# Patient Record
Sex: Female | Born: 1960 | Race: White | Hispanic: No | Marital: Married | Smoking: Never smoker
Health system: Southern US, Community
[De-identification: ages and names within clinical notes are randomized; demographics above are authoritative.]

## PROBLEM LIST (undated history)

## (undated) DIAGNOSIS — I341 Nonrheumatic mitral (valve) prolapse: Secondary | ICD-10-CM

## (undated) DIAGNOSIS — I7300 Raynaud's syndrome without gangrene: Secondary | ICD-10-CM

## (undated) DIAGNOSIS — D649 Anemia, unspecified: Secondary | ICD-10-CM

## (undated) DIAGNOSIS — K589 Irritable bowel syndrome without diarrhea: Secondary | ICD-10-CM

## (undated) DIAGNOSIS — B019 Varicella without complication: Secondary | ICD-10-CM

## (undated) DIAGNOSIS — N390 Urinary tract infection, site not specified: Secondary | ICD-10-CM

## (undated) DIAGNOSIS — Z808 Family history of malignant neoplasm of other organs or systems: Secondary | ICD-10-CM

## (undated) DIAGNOSIS — N83209 Unspecified ovarian cyst, unspecified side: Secondary | ICD-10-CM

## (undated) HISTORY — DX: Irritable bowel syndrome, unspecified: K58.9

## (undated) HISTORY — DX: Urinary tract infection, site not specified: N39.0

## (undated) HISTORY — DX: Unspecified ovarian cyst, unspecified side: N83.209

## (undated) HISTORY — DX: Varicella without complication: B01.9

## (undated) HISTORY — DX: Family history of malignant neoplasm of other organs or systems: Z80.8

## (undated) HISTORY — PX: TONSILLECTOMY: SUR1361

## (undated) HISTORY — DX: Raynaud's syndrome without gangrene: I73.00

## (undated) HISTORY — DX: Anemia, unspecified: D64.9

## (undated) HISTORY — DX: Nonrheumatic mitral (valve) prolapse: I34.1

---

## 1999-09-30 ENCOUNTER — Ambulatory Visit (HOSPITAL_COMMUNITY): Admission: RE | Admit: 1999-09-30 | Discharge: 1999-09-30 | Payer: Self-pay | Admitting: Obstetrics and Gynecology

## 1999-09-30 ENCOUNTER — Encounter: Payer: Self-pay | Admitting: *Deleted

## 1999-10-03 ENCOUNTER — Other Ambulatory Visit: Admission: RE | Admit: 1999-10-03 | Discharge: 1999-10-03 | Payer: Self-pay | Admitting: Obstetrics and Gynecology

## 2000-02-25 ENCOUNTER — Inpatient Hospital Stay (HOSPITAL_COMMUNITY): Admission: AD | Admit: 2000-02-25 | Discharge: 2000-02-27 | Payer: Self-pay | Admitting: Obstetrics and Gynecology

## 2001-01-26 ENCOUNTER — Other Ambulatory Visit: Admission: RE | Admit: 2001-01-26 | Discharge: 2001-01-26 | Payer: Self-pay | Admitting: Obstetrics and Gynecology

## 2002-01-04 ENCOUNTER — Ambulatory Visit (HOSPITAL_COMMUNITY): Admission: RE | Admit: 2002-01-04 | Discharge: 2002-01-04 | Payer: Self-pay | Admitting: Obstetrics and Gynecology

## 2002-01-04 ENCOUNTER — Encounter: Payer: Self-pay | Admitting: Obstetrics and Gynecology

## 2002-01-18 ENCOUNTER — Encounter: Payer: Self-pay | Admitting: Obstetrics and Gynecology

## 2002-01-18 ENCOUNTER — Ambulatory Visit (HOSPITAL_COMMUNITY): Admission: RE | Admit: 2002-01-18 | Discharge: 2002-01-18 | Payer: Self-pay | Admitting: Obstetrics and Gynecology

## 2002-02-01 ENCOUNTER — Encounter: Payer: Self-pay | Admitting: Obstetrics and Gynecology

## 2002-02-01 ENCOUNTER — Ambulatory Visit (HOSPITAL_COMMUNITY): Admission: RE | Admit: 2002-02-01 | Discharge: 2002-02-01 | Payer: Self-pay | Admitting: Obstetrics and Gynecology

## 2002-02-13 ENCOUNTER — Other Ambulatory Visit: Admission: RE | Admit: 2002-02-13 | Discharge: 2002-02-13 | Payer: Self-pay | Admitting: Obstetrics and Gynecology

## 2002-05-02 ENCOUNTER — Ambulatory Visit (HOSPITAL_COMMUNITY): Admission: RE | Admit: 2002-05-02 | Discharge: 2002-05-02 | Payer: Self-pay | Admitting: Obstetrics and Gynecology

## 2002-05-02 ENCOUNTER — Encounter: Payer: Self-pay | Admitting: Obstetrics and Gynecology

## 2002-07-02 ENCOUNTER — Inpatient Hospital Stay (HOSPITAL_COMMUNITY): Admission: AD | Admit: 2002-07-02 | Discharge: 2002-07-03 | Payer: Self-pay | Admitting: Obstetrics and Gynecology

## 2004-04-15 ENCOUNTER — Other Ambulatory Visit: Admission: RE | Admit: 2004-04-15 | Discharge: 2004-04-15 | Payer: Self-pay | Admitting: Obstetrics and Gynecology

## 2004-05-28 ENCOUNTER — Encounter: Payer: Self-pay | Admitting: Internal Medicine

## 2004-06-18 ENCOUNTER — Ambulatory Visit (HOSPITAL_COMMUNITY): Admission: RE | Admit: 2004-06-18 | Discharge: 2004-06-18 | Payer: Self-pay | Admitting: Cardiology

## 2004-06-18 ENCOUNTER — Encounter: Payer: Self-pay | Admitting: Cardiology

## 2004-07-08 ENCOUNTER — Encounter: Payer: Self-pay | Admitting: Internal Medicine

## 2004-07-08 ENCOUNTER — Ambulatory Visit (HOSPITAL_COMMUNITY): Admission: RE | Admit: 2004-07-08 | Discharge: 2004-07-08 | Payer: Self-pay | Admitting: Obstetrics and Gynecology

## 2004-07-08 ENCOUNTER — Ambulatory Visit: Payer: Self-pay | Admitting: Cardiology

## 2004-07-20 ENCOUNTER — Ambulatory Visit: Payer: Self-pay | Admitting: Internal Medicine

## 2004-07-27 ENCOUNTER — Encounter: Admission: RE | Admit: 2004-07-27 | Discharge: 2004-07-27 | Payer: Self-pay | Admitting: Internal Medicine

## 2004-08-06 ENCOUNTER — Ambulatory Visit: Payer: Self-pay | Admitting: Internal Medicine

## 2004-08-09 ENCOUNTER — Encounter: Payer: Self-pay | Admitting: Internal Medicine

## 2007-04-13 DIAGNOSIS — D649 Anemia, unspecified: Secondary | ICD-10-CM | POA: Insufficient documentation

## 2007-04-13 DIAGNOSIS — Z85828 Personal history of other malignant neoplasm of skin: Secondary | ICD-10-CM | POA: Insufficient documentation

## 2007-11-20 ENCOUNTER — Ambulatory Visit (HOSPITAL_COMMUNITY): Admission: RE | Admit: 2007-11-20 | Discharge: 2007-11-20 | Payer: Self-pay | Admitting: Obstetrics and Gynecology

## 2008-01-02 ENCOUNTER — Encounter: Admission: RE | Admit: 2008-01-02 | Discharge: 2008-01-02 | Payer: Self-pay | Admitting: Obstetrics and Gynecology

## 2009-03-19 ENCOUNTER — Ambulatory Visit: Payer: Self-pay | Admitting: Internal Medicine

## 2009-03-19 DIAGNOSIS — R519 Headache, unspecified: Secondary | ICD-10-CM | POA: Insufficient documentation

## 2009-03-19 DIAGNOSIS — N83209 Unspecified ovarian cyst, unspecified side: Secondary | ICD-10-CM | POA: Insufficient documentation

## 2009-03-19 DIAGNOSIS — R209 Unspecified disturbances of skin sensation: Secondary | ICD-10-CM | POA: Insufficient documentation

## 2009-03-19 DIAGNOSIS — Z8679 Personal history of other diseases of the circulatory system: Secondary | ICD-10-CM | POA: Insufficient documentation

## 2009-03-19 DIAGNOSIS — R51 Headache: Secondary | ICD-10-CM | POA: Insufficient documentation

## 2009-03-23 LAB — CONVERTED CEMR LAB
ALT: 21 units/L (ref 0–35)
AST: 24 units/L (ref 0–37)
Albumin: 4.4 g/dL (ref 3.5–5.2)
Alkaline Phosphatase: 39 units/L (ref 39–117)
Basophils Relative: 0.1 % (ref 0.0–3.0)
Bilirubin, Direct: 0.2 mg/dL (ref 0.0–0.3)
CO2: 28 meq/L (ref 19–32)
Chloride: 105 meq/L (ref 96–112)
Eosinophils Relative: 1.2 % (ref 0.0–5.0)
Glucose, Bld: 89 mg/dL (ref 70–99)
HCT: 38.2 % (ref 36.0–46.0)
Lymphs Abs: 1.3 10*3/uL (ref 0.7–4.0)
MCV: 91.7 fL (ref 78.0–100.0)
Monocytes Absolute: 0.6 10*3/uL (ref 0.1–1.0)
Monocytes Relative: 7.7 % (ref 3.0–12.0)
Potassium: 4 meq/L (ref 3.5–5.1)
RBC: 4.16 M/uL (ref 3.87–5.11)
Sodium: 139 meq/L (ref 135–145)
TSH: 1.31 microintl units/mL (ref 0.35–5.50)
Total CHOL/HDL Ratio: 2
Total Protein: 7.4 g/dL (ref 6.0–8.3)
WBC: 8.2 10*3/uL (ref 4.5–10.5)

## 2009-05-21 ENCOUNTER — Ambulatory Visit: Payer: Self-pay | Admitting: Cardiology

## 2010-09-18 ENCOUNTER — Encounter: Payer: Self-pay | Admitting: Obstetrics and Gynecology

## 2011-01-14 NOTE — Op Note (Signed)
[facility]  Patient:    Destiny Carpenter, Destiny Carpenter Visit Number: [id number] MRN: [id number]          Service Type: Attending:  Silverio Lay, M.D. Dictated by:   Silverio Lay, M.D.                             Operative Report  DATE OF BIRTH:                1960-12-13  PREOPERATIVE DIAGNOSES:       1. Advanced maternal age.                               2. Intrauterine pregnancy at 15 weeks and                                  5 days.  POSTOPERATIVE DIAGNOSES:      1. Advanced maternal age.                               2. Intrauterine pregnancy at 15 weeks and                                  5 days.  OPERATION:                    Amniocentesis.  SURGEON:                      Silverio Lay, M.D.  ANESTHESIA:                   None.  DESCRIPTION OF PROCEDURE:     After being informed of the planned procedure, namely diagnostic genetic amniocentesis, as well as the possible complications including premature rupture of membranes, infection, bleeding, and miscarriage, rate of 1 in 200.  Informed consent was obtained and the patient was placed in the supine position.  With ultrasound, we confirmed placental location which is fundal and anterior as well as a live single intrauterine pregnancy with the heart rate in the 140s to 150s.  The patient declined local anesthesia.  She was then prepped with Betadine and draped, and under ultrasound guidance, using a 22-gauge needle, a single puncture was made to reach a large pocket of amniotic fluid and to avoid transplacental passage. Twenty-one cubic centimeters of clear fluid was removed.  The needle was promptly removed and post amniocentesis ultrasound revealed a live viable female infant with a heart rate of 147.  FINAL DIAGNOSIS:              Advanced maternal age, status post                               amniocentesis.  CONDITION AT DISCHARGE:       Well and stable. Dictated by:   Silverio Lay,  M.D. Attending:  Silverio Lay, M.D. DD:  01/04/02 TD:  01/07/02 Job: [id number]

## 2011-01-14 NOTE — H&P (Signed)
   NAME:  Destiny Carpenter, Destiny Carpenter NO.:  [id number]   MEDICAL RECORD NO.:  [id number]                   PATIENT TYPE:  MAT   LOCATION:  [facility]                                 FACILITY:  [facility]   PHYSICIAN:  Osborn Coho, M.D.                DATE OF BIRTH:  [birth year]   DATE OF ADMISSION:  07/02/2002  DATE OF DISCHARGE:                                HISTORY & PHYSICAL   HISTORY OF PRESENT ILLNESS:  This is a 50 year old gravida 3, para 1-0-1-1  at 48 and 2/7 weeks who presents with active labor.  She denies any leaking  or bleeding.  Reports positive fetal movement.  Contractions began last  night and increased in intensity this morning.  Pregnancy has been followed  by the nurse midwife service and remarkable for AMA, abnormal LMP, group B  Strep negative, fetal renal pyelectasis.   OB HISTORY:  Remarkable for vaginal delivery in 2001 of a female infant at  [redacted] weeks gestation weighing 5 pounds 10 ounces complicated by IUGR and  oligohydramnios.  She had a spontaneous abortion in 2002 in the first  trimester with no complications.   PAST MEDICAL HISTORY:  Remarkable for varicella immunization, history of low  grade skin cancer, history of a concussion at age 48, IBS, and mitral valve  prolapse.   PAST SURGICAL HISTORY:  Remarkable for tonsillectomy.   FAMILY HISTORY:  Remarkable for a grandmother with MI and hypertension.  Mother with varicosities.   GENETIC HISTORY:  Unremarkable.   SOCIAL HISTORY:  The patient is married to [employer] who is involved  and supportive.  She is of the Protestant faith.  She denies any alcohol,  tobacco, or drug use.   PHYSICAL EXAMINATION:  VITAL SIGNS:  Stable.  Afebrile.  HEENT:  Within normal limits.  NECK:  Thyroid normal, not enlarged.  CHEST:  Clear to auscultation.  BREASTS:  Soft, nontender.  ABDOMEN:  Gravid at 35 cm.  Vertex to Leopold's.  EFM shows reassuring fetal  heart rate with uterine contractions  every four to five minutes.  PELVIC:  Cervical examination is 8 cm, 80% effaced, -1/0 station, vertex  presentation.  EXTREMITIES:  Within normal limits.   ASSESSMENT:  1. Intrauterine pregnancy at 41 and 2/7 weeks.  2. Active labor, transition.  3. Group B Strep negative.    PLAN:  1. Admit to birthing suite.  Dr. Su Hilt notified.  2. Routine C.N.M. orders.  3. Anticipate SVD.     Marie L. Williams, C.N.M.                 Osborn Coho, M.D.    MLW/MEDQ  D:  07/02/2002  T:  07/02/2002  Job:  [id number]

## 2011-03-09 ENCOUNTER — Other Ambulatory Visit: Payer: Self-pay | Admitting: Obstetrics and Gynecology

## 2011-03-09 DIAGNOSIS — Z1231 Encounter for screening mammogram for malignant neoplasm of breast: Secondary | ICD-10-CM

## 2011-03-10 ENCOUNTER — Ambulatory Visit
Admission: RE | Admit: 2011-03-10 | Discharge: 2011-03-10 | Disposition: A | Discharge status: Home / Self Care | Payer: BC Managed Care – PPO | Source: Ambulatory Visit | Attending: Obstetrics and Gynecology | Admitting: Obstetrics and Gynecology

## 2011-03-10 DIAGNOSIS — Z1231 Encounter for screening mammogram for malignant neoplasm of breast: Secondary | ICD-10-CM

## 2011-06-10 ENCOUNTER — Ambulatory Visit (INDEPENDENT_AMBULATORY_CARE_PROVIDER_SITE_OTHER): Payer: BC Managed Care – PPO

## 2011-06-10 DIAGNOSIS — Z23 Encounter for immunization: Secondary | ICD-10-CM

## 2011-08-26 ENCOUNTER — Encounter: Payer: Self-pay | Admitting: Internal Medicine

## 2011-09-13 ENCOUNTER — Other Ambulatory Visit (INDEPENDENT_AMBULATORY_CARE_PROVIDER_SITE_OTHER): Payer: BC Managed Care – PPO

## 2011-09-13 DIAGNOSIS — Z0000 Encounter for general adult medical examination without abnormal findings: Secondary | ICD-10-CM

## 2011-09-13 LAB — POCT URINALYSIS DIPSTICK
Bilirubin, UA: NEGATIVE
Glucose, UA: NEGATIVE
Ketones, UA: NEGATIVE
Leukocytes, UA: NEGATIVE
Protein, UA: NEGATIVE
Spec Grav, UA: 1.02

## 2011-09-13 LAB — HEPATIC FUNCTION PANEL
ALT: 25 U/L (ref 0–35)
AST: 27 U/L (ref 0–37)
Albumin: 4.4 g/dL (ref 3.5–5.2)
Total Bilirubin: 0.9 mg/dL (ref 0.3–1.2)
Total Protein: 6.7 g/dL (ref 6.0–8.3)

## 2011-09-13 LAB — BASIC METABOLIC PANEL
BUN: 15 mg/dL (ref 6–23)
Chloride: 102 mEq/L (ref 96–112)
GFR: 77.75 mL/min (ref >60.00)
Glucose, Bld: 84 mg/dL (ref 70–99)
Potassium: 3.7 mEq/L (ref 3.5–5.1)
Sodium: 138 mEq/L (ref 135–145)

## 2011-09-13 LAB — TSH: TSH: 0.93 u[IU]/mL (ref 0.35–5.50)

## 2011-09-13 LAB — CBC WITH DIFFERENTIAL/PLATELET
Eosinophils Relative: 2.1 % (ref 0.0–5.0)
HCT: 37.2 % (ref 36.0–46.0)
Hemoglobin: 12.7 g/dL (ref 12.0–15.0)
Lymphs Abs: 0.9 10*3/uL (ref 0.7–4.0)
MCV: 94.6 fl (ref 78.0–100.0)
Monocytes Absolute: 0.5 10*3/uL (ref 0.1–1.0)
Monocytes Relative: 9 % (ref 3.0–12.0)
Neutro Abs: 3.8 10*3/uL (ref 1.4–7.7)
Platelets: 216 10*3/uL (ref 150.0–400.0)
WBC: 5.4 10*3/uL (ref 4.5–10.5)

## 2011-09-13 LAB — LIPID PANEL: Cholesterol: 160 mg/dL (ref 0–200)

## 2011-09-14 LAB — VITAMIN D 25 HYDROXY (VIT D DEFICIENCY, FRACTURES): Vit D, 25-Hydroxy: 42 ng/mL (ref 30–89)

## 2011-09-20 ENCOUNTER — Ambulatory Visit (INDEPENDENT_AMBULATORY_CARE_PROVIDER_SITE_OTHER): Payer: BC Managed Care – PPO | Admitting: Internal Medicine

## 2011-09-20 ENCOUNTER — Other Ambulatory Visit (HOSPITAL_COMMUNITY)
Admission: RE | Admit: 2011-09-20 | Discharge: 2011-09-20 | Disposition: A | Discharge status: Home / Self Care | Payer: BC Managed Care – PPO | Source: Ambulatory Visit | Attending: Internal Medicine | Admitting: Internal Medicine

## 2011-09-20 ENCOUNTER — Encounter: Payer: Self-pay | Admitting: Internal Medicine

## 2011-09-20 VITALS — BP 100/60 | HR 78 | Ht 67.5 in | Wt 127.0 lb

## 2011-09-20 DIAGNOSIS — Z01419 Encounter for gynecological examination (general) (routine) without abnormal findings: Secondary | ICD-10-CM | POA: Insufficient documentation

## 2011-09-20 DIAGNOSIS — H6980 Other specified disorders of Eustachian tube, unspecified ear: Secondary | ICD-10-CM | POA: Insufficient documentation

## 2011-09-20 DIAGNOSIS — Z8679 Personal history of other diseases of the circulatory system: Secondary | ICD-10-CM

## 2011-09-20 DIAGNOSIS — H5789 Other specified disorders of eye and adnexa: Secondary | ICD-10-CM

## 2011-09-20 DIAGNOSIS — Z0000 Encounter for general adult medical examination without abnormal findings: Secondary | ICD-10-CM | POA: Insufficient documentation

## 2011-09-20 NOTE — Progress Notes (Signed)
Subjective:    Patient ID: Destiny Carpenter, female    DOB: December 18, 1960, 51 y.o.   MRN: [id number]  HPI Patient comes in today for Preventive Health Care visit  Has a few concerns. Generally healthy but recently has had a ur sx and eye sx . Left eye red at times. Drops Mild  head ache  For weeks and feels like ears popping.   And hard to clear.  Some congestion  Slight blood recently .   And tried  Guifenesin. For 3 days and sudafed. No change. Left eye more clogged In am.  Extended right elbow   Tennis  And problem  But getting better  Trained for a mini triathalon.   In summer and left knee achesd after running but exercising  45 minutes.  3 x per week.  Periods  Every 4 weeks and on time.    Contraception :  Rhythm .  Plus.   Due for pap.  Has hx of MVP  And supposed to get cards check every other year but thinks it can wait  As has no exercise sx  Cost is an issue with insurance plan.  Review of Systems ROS:  GEN/ HEENTNo fever, significant weight changes sweats headaches vision problems hearing changes, has glasses  See HPI  CV/ PULM; No chest pain shortness of breath cough, syncope,edema  change in exercise tolerance. GI /GU: No adominal pain, vomiting, change in bowel habits. No blood in the stool. No significant GU symptoms. SKIN/HEME: ,no acute skin rashes suspicious lesions or bleeding. No lymphadenopathy, nodules, masses.  NEURO/ PSYCH:  No neurologic signs such as weakness numbness No depression anxiety. IMM/ Allergy: No unusual infections.  Allergy .   REST of 12 system review negative  Past Medical History  Diagnosis Date  . Personal history of unspecified circulatory disease   . Other and unspecified ovarian cyst   . Family history of other specified malignant neoplasm   . Anemia   . UTI (lower urinary tract infection)   . Varicella     History   Social History  . Marital Status: Married    Spouse Name: N/A    Number of Children: N/A  . Years of  Education: N/A   Occupational History  . Not on file.   Social History Main Topics  . Smoking status: Never Smoker   . Smokeless tobacco: Not on file  . Alcohol Use: Yes     5 times a week  . Drug Use: No  . Sexually Active: Not on file   Other Topics Concern  . Not on file   Social History Narrative   MarriedHomemakerHH of 4Pet CarPhD MathPart time    Past Surgical History  Procedure Date  . Tonsillectomy     Family History  Problem Relation Age of Onset  . Hyperlipidemia    . Hypertension    . Melanoma    . Skin cancer      No Known Allergies  Current Outpatient Prescriptions on File Prior to Visit  Medication Sig Dispense Refill  . VITAMIN D, CHOLECALCIFEROL, PO Take by mouth.          BP 100/60  Pulse 78  Ht 5' 7.5" (1.715 m)  Wt 127 lb (57.607 kg)  BMI 19.60 kg/m2  LMP 08/30/2011       Objective:   Physical Exam  Physical Exam: Vital signs reviewed WUJ:WJXB is a well-developed well-nourished alert cooperative  white female who appears her stated age in  no acute distress.  HEENT: normocephalic atraumatic , Eyes: PERRL EOM's full, conjunctiva left lateral area is red but no flush and discharge , Nares: paten,t no deformity discharge or tenderness.,mild congestion Ears: no deformity EAC's clear TMs with normal landmarks. Mouth: clear OP, no lesions, edema.  Moist mucous membranes. Dentition in adequate repair. NECK: supple without masses, thyromegaly or bruits. Breast: normal by inspection . No dimpling, discharge, masses, tenderness or discharge . CHEST/PULM:  Clear to auscultation and percussion breath sounds equal no wheeze , rales or rhonchi. No chest wall deformities or tenderness. CV: PMI is nondisplaced, S1 S2 no gallops, murmurs, rubs ? Intermittent click. Peripheral pulses are full without delay.No JVD .  ABDOMEN: Bowel sounds normal nontender  No guard or rebound, no hepato splenomegal no CVA tenderness.  No hernia. Extremtities:  No clubbing  cyanosis or edema, no acute joint swelling or redness no focal atrophy NEURO:  Oriented x3, cranial nerves 3-12 appear to be intact, no obvious focal weakness,gait within normal limits no abnormal reflexes or asymmetrical SKIN: No acute rashes normal turgor, color, no bruising or petechiae. PSYCH: Oriented, good eye contact, no obvious depression anxiety, cognition and judgment appear normal. LN: no cervical axillary inguinal adenopathy Pelvic: NL ext GU, labia clear without lesions or rash . Vagina no lesions .Cervix: clear  UTERUS: Neg CMT Adnexa:  clear no masses . PAP done rectal neg stool heme  Lab Results  Component Value Date   WBC 5.4 09/13/2011   HGB 12.7 09/13/2011   HCT 37.2 09/13/2011   PLT 216.0 09/13/2011   GLUCOSE 84 09/13/2011   CHOL 160 09/13/2011   TRIG 49.0 09/13/2011   HDL 93.20 09/13/2011   LDLCALC 57 09/13/2011   ALT 25 09/13/2011   AST 27 09/13/2011   NA 138 09/13/2011   K 3.7 09/13/2011   CL 102 09/13/2011   CREATININE 0.8 09/13/2011   BUN 15 09/13/2011   CO2 28 09/13/2011   TSH 0.93 09/13/2011            Assessment & Plan:  Preventive Health Care Counseled regarding healthy nutrition, exercise, sleep, injury prevention, calcium vit d and healthy weight . Cont vit d and exercise for now Nasal congestion  Mostly left eustachian tube dyfsunction.   Can try sample of nasonex   For 1-2 weeks etc  Fu if    persistent or progressive  Left eye  Lateral redness  nodc  Noted   Consider see eye doc if  persistent or progressive   Hx of mVP  No m heard today  Sees cards but prefers  Less often cause of cost   Exam  Ok today as well as exercise tolerance at this time.

## 2011-09-20 NOTE — Patient Instructions (Addendum)
Your sx act like eustachian tube dysfunction. Can try plain claritin or allegra or zyrtec  Saline washes   And  Trial of nasonex 2 sp each nostril each day to see if helps.  Have your eye doctor see your eye redness.   Otherwise Continue lifestyle intervention healthy eating and exercise . Will contact you about pap results when available.  If ok PAP every 3 years   mammo every 2 years  colonoscopy at age 51  Continue vit d for now.

## 2011-09-22 ENCOUNTER — Encounter: Payer: Self-pay | Admitting: Internal Medicine

## 2011-09-22 DIAGNOSIS — H5789 Other specified disorders of eye and adnexa: Secondary | ICD-10-CM | POA: Insufficient documentation

## 2011-09-22 NOTE — Progress Notes (Signed)
Quick Note:  Tell patient PAP is normal. ______ 

## 2011-09-26 ENCOUNTER — Encounter: Payer: Self-pay | Admitting: *Deleted

## 2011-09-26 NOTE — Progress Notes (Signed)
Quick Note:    Letter sent to pt.  ______

## 2012-07-10 ENCOUNTER — Ambulatory Visit (INDEPENDENT_AMBULATORY_CARE_PROVIDER_SITE_OTHER): Payer: BC Managed Care – PPO | Admitting: Family Medicine

## 2012-07-10 DIAGNOSIS — Z23 Encounter for immunization: Secondary | ICD-10-CM

## 2012-09-12 ENCOUNTER — Telehealth: Payer: Self-pay | Admitting: Internal Medicine

## 2012-09-12 NOTE — Telephone Encounter (Signed)
[facility] Triage Call Report Triage Record Num: [id number] Operator: Freddie Breech [name] Call Date & Time: 09/11/2012 6:04:00PM Patient Phone: [phone] PCP: Neta Mends. Panosh Patient Gender: Female PCP Fax : [phone] Patient DOB: 30-Aug-1960 [facility] Name: [facility] Reason for Call: Pt is calling to report urinary frequency, buring and hematuria onset 09/09/12. Advised UC now per [facility]y U[facility] Protocol. Protocol(s) Used: [facility] U[facility] Recommended Outcome per Protocol: See Provider within 4 hours Reason for Outcome: Urinary tract symptoms AND any flank, low back, lower abdominal or genital area (labia, vagina OR testicle/scrotum) pain Care Advice: ~ Another adult should drive. 01

## 2014-06-24 ENCOUNTER — Ambulatory Visit (INDEPENDENT_AMBULATORY_CARE_PROVIDER_SITE_OTHER): Payer: BC Managed Care – PPO

## 2014-06-24 DIAGNOSIS — Z23 Encounter for immunization: Secondary | ICD-10-CM

## 2014-06-30 ENCOUNTER — Encounter: Payer: Self-pay | Admitting: Internal Medicine

## 2014-07-14 NOTE — Progress Notes (Signed)
     HPI:  Evaluation of mitral valve prolapse. An echocardiogram in September of 2005 showed normal LV function, small pericardial effusion and an echobright region on the tricuspid valve suggesting nodule. A transesophageal echocardiogram in October of 2005 revealed normal LV function, mild MV prolapse involving the anterior leaflet with mild mitral regurgitation, an elongated redundant tricuspid valve with prolapse and mild tricuspid regurgitation as well as a small pericardial effusion. A stress echocardiogram was also performed in October 2005. This was normal. Patient has dyspnea with more extreme activities and not routine activities. No orthopnea, PND, pedal edema or syncope. Approximately one year ago she had chest discomfort when she felt sad but has no Had no exertional chest pain. She occasionally feels her heartbeat but checked her pulse and it is 60. No sustained palpitations.  No current outpatient prescriptions on file.   No current facility-administered medications for this visit.    No Known Allergies   Past Medical History  Diagnosis Date  . MVP (mitral valve prolapse)   . Other and unspecified ovarian cyst   . Family history of other specified malignant neoplasm   . Anemia   . UTI (lower urinary tract infection)   . Varicella   . IBS (irritable bowel syndrome)   . Raynaud's disease     Past Surgical History  Procedure Laterality Date  . Tonsillectomy      History   Social History  . Marital Status: Married    Spouse Name: N/A    Number of Children: 2  . Years of Education: N/A   Occupational History  . Not on file.   Social History Main Topics  . Smoking status: Never Smoker   . Smokeless tobacco: Not on file  . Alcohol Use: Yes     Comment: 5 times a week  . Drug Use: No  . Sexual Activity: Not on file   Other Topics Concern  . 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 Hyperlipidemia    . Hypertension Father   . Melanoma    . Skin cancer      ROS: no fevers or chills, productive cough, hemoptysis, dysphasia, odynophagia, melena, hematochezia, dysuria, hematuria, rash, seizure activity, orthopnea, PND, pedal edema, claudication. Remaining systems are negative.  Physical Exam:   Blood pressure 100/60, pulse 62, height 5\' 8"  (1.727 m), weight 117 lb 4.8 oz (53.207 kg).  General:  Well developed/well nourished in NAD Skin warm/dry Patient not depressed No peripheral clubbing Back-normal HEENT-normal/normal eyelids Neck supple/normal carotid upstroke bilaterally; no bruits; no JVD; no thyromegaly chest - CTA/ normal expansion CV - RRR/normal S1 and S2; no murmurs, rubs or gallops;  PMI nondisplaced Abdomen -NT/ND, no HSM, no mass, + bowel sounds, no bruit 2+ femoral pulses, no bruits Ext-no edema, chords, 2+ DP Neuro-grossly nonfocal  ECG Sinus rhythm, right axis deviation, RV conduction delay, poor R-wave progression, nonspecific ST changes.

## 2014-07-18 ENCOUNTER — Encounter: Payer: Self-pay | Admitting: Cardiology

## 2014-07-18 ENCOUNTER — Ambulatory Visit (INDEPENDENT_AMBULATORY_CARE_PROVIDER_SITE_OTHER): Payer: BC Managed Care – PPO | Admitting: Cardiology

## 2014-07-18 ENCOUNTER — Encounter: Payer: Self-pay | Admitting: *Deleted

## 2014-07-18 VITALS — BP 100/60 | HR 62 | Ht 68.0 in | Wt 117.3 lb

## 2014-07-18 DIAGNOSIS — I341 Nonrheumatic mitral (valve) prolapse: Secondary | ICD-10-CM

## 2014-07-18 DIAGNOSIS — R079 Chest pain, unspecified: Secondary | ICD-10-CM | POA: Insufficient documentation

## 2014-07-18 DIAGNOSIS — R072 Precordial pain: Secondary | ICD-10-CM

## 2014-07-18 DIAGNOSIS — Z8679 Personal history of other diseases of the circulatory system: Secondary | ICD-10-CM

## 2014-07-18 NOTE — Assessment & Plan Note (Signed)
Symptoms are very atypical. They occurred when she felt sad. She has not had exertional chest pain. Will not pursue further ischemia evaluation.

## 2014-07-18 NOTE — Assessment & Plan Note (Addendum)
Plan repeat echocardiogram For mitral valve prolapse..Marland Kitchen

## 2014-07-18 NOTE — Patient Instructions (Signed)
Your physician recommends that you schedule a follow-up appointment in: AS NEEDED  Your physician has requested that you have an echocardiogram. Echocardiography is a painless test that uses sound waves to create images of your heart. It provides your doctor with information about the size and shape of your heart and how well your heart's chambers and valves are working. This procedure takes approximately one hour. There are no restrictions for this procedure.   

## 2014-07-28 ENCOUNTER — Ambulatory Visit (HOSPITAL_COMMUNITY): Payer: BC Managed Care – PPO

## 2014-07-29 ENCOUNTER — Ambulatory Visit (HOSPITAL_COMMUNITY)
Admission: RE | Admit: 2014-07-29 | Discharge: 2014-07-29 | Disposition: A | Discharge status: Home / Self Care | Payer: BC Managed Care – PPO | Source: Ambulatory Visit | Attending: Cardiovascular Disease | Admitting: Cardiovascular Disease

## 2014-07-29 DIAGNOSIS — I059 Rheumatic mitral valve disease, unspecified: Secondary | ICD-10-CM

## 2014-07-29 DIAGNOSIS — I341 Nonrheumatic mitral (valve) prolapse: Secondary | ICD-10-CM | POA: Diagnosis not present

## 2014-07-29 NOTE — Progress Notes (Signed)
2D Echocardiogram Complete.  07/29/2014   Dreux Mcgroarty, RDCS  

## 2014-07-31 ENCOUNTER — Encounter: Payer: Self-pay | Admitting: Cardiology

## 2014-07-31 NOTE — Telephone Encounter (Signed)
Pt is returning Debra's call in regards to some results from her Echo that was done on 12/1. Please call  Thanks

## 2014-07-31 NOTE — Telephone Encounter (Signed)
This encounter was created in error - please disregard.

## 2014-09-10 ENCOUNTER — Other Ambulatory Visit (INDEPENDENT_AMBULATORY_CARE_PROVIDER_SITE_OTHER): Payer: BLUE CROSS/BLUE SHIELD

## 2014-09-10 DIAGNOSIS — Z0000 Encounter for general adult medical examination without abnormal findings: Secondary | ICD-10-CM

## 2014-09-10 LAB — BASIC METABOLIC PANEL
BUN: 11 mg/dL (ref 6–23)
CALCIUM: 9.5 mg/dL (ref 8.4–10.5)
CHLORIDE: 103 meq/L (ref 96–112)
CO2: 27 meq/L (ref 19–32)
CREATININE: 0.87 mg/dL (ref 0.40–1.20)
GFR: 72.75 mL/min (ref >60.00)
GLUCOSE: 85 mg/dL (ref 70–99)
Potassium: 4.3 mEq/L (ref 3.5–5.1)
Sodium: 139 mEq/L (ref 135–145)

## 2014-09-10 LAB — LIPID PANEL
CHOL/HDL RATIO: 2
Cholesterol: 149 mg/dL (ref 0–200)
HDL: 84.5 mg/dL (ref >39.00)
LDL CALC: 52 mg/dL (ref 0–99)
NONHDL: 64.5
TRIGLYCERIDES: 65 mg/dL (ref 0.0–149.0)
VLDL: 13 mg/dL (ref 0.0–40.0)

## 2014-09-10 LAB — CBC WITH DIFFERENTIAL/PLATELET
BASOS ABS: 0 10*3/uL (ref 0.0–0.1)
Basophils Relative: 0.9 % (ref 0.0–3.0)
EOS ABS: 0.1 10*3/uL (ref 0.0–0.7)
Eosinophils Relative: 2.6 % (ref 0.0–5.0)
HCT: 39.5 % (ref 36.0–46.0)
HEMOGLOBIN: 12.9 g/dL (ref 12.0–15.0)
LYMPHS ABS: 1.1 10*3/uL (ref 0.7–4.0)
Lymphocytes Relative: 19.9 % (ref 12.0–46.0)
MCHC: 32.7 g/dL (ref 30.0–36.0)
MCV: 93.8 fl (ref 78.0–100.0)
MONO ABS: 0.5 10*3/uL (ref 0.1–1.0)
MONOS PCT: 8.7 % (ref 3.0–12.0)
Neutro Abs: 3.8 10*3/uL (ref 1.4–7.7)
Neutrophils Relative %: 67.9 % (ref 43.0–77.0)
PLATELETS: 223 10*3/uL (ref 150.0–400.0)
RBC: 4.21 Mil/uL (ref 3.87–5.11)
RDW: 14 % (ref 11.5–15.5)
WBC: 5.6 10*3/uL (ref 4.0–10.5)

## 2014-09-10 LAB — HEPATIC FUNCTION PANEL
ALK PHOS: 39 U/L (ref 39–117)
ALT: 25 U/L (ref 0–35)
AST: 25 U/L (ref 0–37)
Albumin: 4.4 g/dL (ref 3.5–5.2)
BILIRUBIN DIRECT: 0.1 mg/dL (ref 0.0–0.3)
BILIRUBIN TOTAL: 0.7 mg/dL (ref 0.2–1.2)
Total Protein: 7.1 g/dL (ref 6.0–8.3)

## 2014-09-10 LAB — TSH: TSH: 1.8 u[IU]/mL (ref 0.35–4.50)

## 2014-09-17 ENCOUNTER — Ambulatory Visit (INDEPENDENT_AMBULATORY_CARE_PROVIDER_SITE_OTHER): Payer: BLUE CROSS/BLUE SHIELD | Admitting: Internal Medicine

## 2014-09-17 ENCOUNTER — Encounter: Payer: Self-pay | Admitting: Internal Medicine

## 2014-09-17 VITALS — BP 116/74 | Temp 98.4°F | Ht 67.5 in | Wt 119.0 lb

## 2014-09-17 DIAGNOSIS — Z0000 Encounter for general adult medical examination without abnormal findings: Secondary | ICD-10-CM

## 2014-09-17 DIAGNOSIS — Z1211 Encounter for screening for malignant neoplasm of colon: Secondary | ICD-10-CM | POA: Insufficient documentation

## 2014-09-17 DIAGNOSIS — Z9181 History of falling: Secondary | ICD-10-CM

## 2014-09-17 NOTE — Patient Instructions (Addendum)
Screening  Colon cancer    Stool cards at this point  Colonoscopy.  If you wish and contacts us  Continue lifestyle intervention healthy eating and exercise .  Healthy lifestyle includes : At least 150 minutes of exercise weeks  , weight at healthy levels, which is usually   BMI 19-25. Avoid trans fats and processed foods;  Increase fresh fruits and veges to 5 servings per day. And avoid sweet beverages including tea and juice. Mediterranean diet with olive oil and nuts have been noted to be heart and brain healthy . Avoid tobacco products . Limit  alcohol to  7 per week for women and 14 servings for men.  Get adequate sleep . Wear seat belts . Don't text and drive .

## 2014-09-17 NOTE — Progress Notes (Signed)
Pre visit review using our clinic review tool, if applicable. No additional management support is needed unless otherwise documented below in the visit note.  Chief Complaint  Patient presents with  . Annual Exam    HPI: Patient  Destiny Carpenter  54 y.o. comes in today for Preventive Health Care visit  sicne last visit less  Stress but had cards eval and  No new worries  No need for sbe prophyl. Echo and atypical cp following  Had fall  Down steps talking glasses bifoocals  Caused this  Larey SeatFell on back a few months ago . Ok but still elft lower back discomfort beginning to stretch and exercise it .  Health Maintenance  Topic Date Due  . COLONOSCOPY  12/19/2012  . MAMMOGRAM  03/09/2013  . PAP SMEAR  09/19/2014  . INFLUENZA VACCINE  03/30/2015  . TETANUS/TDAP  03/20/2019   Health Maintenance Review LIFESTYLE:  Exercise:   Tobacco/ETS:no Alcohol:   Per week x 4-5 days Sugar beverages:no Sleep: ok  Drug use: no Colonoscopy:  Hesitant to do becuaseo f risk  Has /s PAP: utd  ROS:  GEN/ HEENT: No fever, significant weight changes sweats headaches vision problems hearing changes, CV/ PULM; No chest pain shortness of breath cough, syncope,edema  change in exercise tolerance. GI /GU: No adominal pain, vomiting, change in bowel habits. No blood in the stool. No significant GU symptoms. SKIN/HEME: ,no acute skin rashes suspicious lesions or bleeding. No lymphadenopathy, nodules, masses.  NEURO/ PSYCH:  No neurologic signs such as weakness numbness. No depression anxiety. IMM/ Allergy: No unusual infections.  Allergy .   REST of 12 system review negative except as per HPI   Past Medical History  Diagnosis Date  . MVP (mitral valve prolapse)   . Other and unspecified ovarian cyst   . Family history of other specified malignant neoplasm   . Anemia   . UTI (lower urinary tract infection)   . Varicella   . IBS (irritable bowel syndrome)   . Raynaud's disease     Past  Surgical History  Procedure Laterality Date  . Tonsillectomy      Family History  Problem Relation Age of Onset  . Hyperlipidemia    . Hypertension Father   . Melanoma    . Skin cancer      History   Social History  . Marital Status: Married    Spouse Name: N/A    Number of Children: 2  . Years of Education: N/A   Social History Main Topics  . Smoking status: Never Smoker   . Smokeless tobacco: None  . Alcohol Use: Yes     Comment: 5 times a week  . Drug Use: No  . Sexual Activity: None   Other Topics Concern  . 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    EXAM:  BP 116/74 mmHg  Temp(Src) 98.4 F (36.9 C) (Oral)  Ht 5' 7.5" (1.715 m)  Wt 119 lb (53.978 kg)  BMI 18.35 kg/m2  Body mass index is 18.35 kg/(m^2).  Physical Exam: Vital signs reviewed ZOX:WRUEGEN:This is a well-developed well-nourished alert cooperative    who appearsr stated age in no acute distress.  HEENT: normocephalic atraumatic , Eyes: PERRL EOM's full, conjunctiva  clear, Nares: paten,t no deformity discharge or tenderness., Ears: no deformity EAC's clear TMs with normal landmarks. Mouth: clear OP, no lesions, edema.  Moist mucous membranes. Dentition in adequate repair. NECK: supple without masses, thyromegaly or bruits. CHEST/PULM:  Clear to auscultation and percussion breath sounds equal no wheeze , rales or rhonchi. No chest wall deformities or tenderness. Breast: normal by inspection . No dimpling, discharge, masses, tenderness or discharge . CV: PMI is nondisplaced, S1 S2 no gallops, murmurs, rubs. Peripheral pulses are full without delay.No JVD .  ABDOMEN: Bowel sounds normal nontender  No guard or rebound, no hepato splenomegal no CVA tenderness.  No hernia. Extremtities:  No clubbing cyanosis or edema, no acute joint  swelling or redness no focal atrophy points to lower right sacral lumbar area and no bony tenderness or spine tenderness NEURO:  Oriented x3, cranial nerves 3-12 appear to be intact, no obvious focal weakness,gait within normal limits no abnormal reflexes or asymmetrical SKIN: No acute rashes normal turgor, color, no bruising or petechiae. PSYCH: Oriented, good eye contact, no obvious depression anxiety, cognition and judgment appear normal. LN: no cervical axillary inguinal adenopathy  Lab Results  Component Value Date   WBC 5.6 09/10/2014   HGB 12.9 09/10/2014   HCT 39.5 09/10/2014   PLT 223.0 09/10/2014   GLUCOSE 85 09/10/2014   CHOL 149 09/10/2014   TRIG 65.0 09/10/2014   HDL 84.50 09/10/2014   LDLCALC 52 09/10/2014   ALT 25 09/10/2014   AST 25 09/10/2014   NA 139 09/10/2014   K 4.3 09/10/2014   CL 103 09/10/2014   CREATININE 0.87 09/10/2014   BUN 11 09/10/2014   CO2 27 09/10/2014   TSH 1.80 09/10/2014    ASSESSMENT AND PLAN:  Discussed the following assessment and plan:  Visit for preventive health examination  Colon cancer screening - disc options  ifob  this year  - Plan: Fecal occult blood, imunochemical  Hx of fall - back should get better no alarm features agree about changin glasses situation Colon  cancer screening discussed witll look into it  For now IFOB for screening she is average risk  And no sx Counseled regarding healthy nutrition, exercise, sleep, injury prevention, calcium vit d and healthy weight .  Fall prevention Patient Care Team: Madelin Headings, MD as PCP - General Amy [place], MD as Consulting Physician (Dermatology) Lewayne Bunting, MD (Cardiology) Patient Instructions  Screening  Colon cancer    Stool cards at this point  Colonoscopy.  If you wish and contacts [place]  Continue lifestyle intervention healthy eating and exercise .  Healthy lifestyle includes : At least 150 minutes of exercise weeks  , weight at healthy levels, which is  usually   BMI 19-25. Avoid trans fats and processed foods;  Increase fresh fruits and veges to 5 servings per day. And avoid sweet beverages including tea and juice. Mediterranean diet with olive oil and nuts have been noted to be heart and brain healthy . Avoid tobacco products . Limit  alcohol to  7 per week for women and 14 servings for men.  Get adequate sleep . Wear seat belts . Don't text and drive .       Neta Mends. Panosh M.D.

## 2015-06-12 ENCOUNTER — Ambulatory Visit (INDEPENDENT_AMBULATORY_CARE_PROVIDER_SITE_OTHER): Payer: BLUE CROSS/BLUE SHIELD | Admitting: Internal Medicine

## 2015-06-12 ENCOUNTER — Encounter: Payer: Self-pay | Admitting: Internal Medicine

## 2015-06-12 VITALS — BP 108/72 | Temp 98.6°F | Wt 120.0 lb

## 2015-06-12 DIAGNOSIS — J029 Acute pharyngitis, unspecified: Secondary | ICD-10-CM

## 2015-06-12 LAB — POCT RAPID STREP A (OFFICE): RAPID STREP A SCREEN: NEGATIVE

## 2015-06-12 NOTE — Progress Notes (Signed)
Pre visit review using our clinic review tool, if applicable. No additional management support is needed unless otherwise documented below in the visit note.  Chief Complaint  Patient presents with  . Sore Throat  . Chills  . Generalized Body Aches  . Sneezing    HPI: Patient Destiny Carpenter  comes in today for SDA for  new problem evaluation. Onset  3 days  Sore throat worse in 10- years nd then  Flu like achy   . ? Minor cough this am   .  Achy back   Throat  better today  Min coryza   Cepacol.   Chills   No documented fever   ROS: See pertinent positives and negatives per HPI. Neg gi rash exposures  [place] hasnt had flu vaccine yet    Past Medical History  Diagnosis Date  . MVP (mitral valve prolapse)   . Other and unspecified ovarian cyst   . Family history of other specified malignant neoplasm   . Anemia   . UTI (lower urinary tract infection)   . Varicella   . IBS (irritable bowel syndrome)   . Raynaud's disease     Family History  Problem Relation Age of Onset  . Hyperlipidemia    . Hypertension Father   . Melanoma    . Skin cancer      Social History   Social History  . Marital Status: Married    Spouse Name: N/A  . Number of Children: 2  . Years of Education: N/A   Social History Main Topics  . Smoking status: Never Smoker   . Smokeless tobacco: None  . Alcohol Use: Yes     Comment: 5 times a week  . Drug Use: No  . Sexual Activity: Not Asked   Other Topics Concern  . 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   No facility-administered medications prior to visit.     EXAM:  BP 108/72 mmHg  Temp(Src) 98.6 F (37 C) (Oral)  Wt 120 lb (54.432 kg)  Body mass index is 18.51 kg/(m^2).  GENERAL: vitals reviewed and listed above, alert, oriented, appears well hydrated and in no  acute distress non toxic  HEENT: atraumatic, conjunctiva  clear, no obvious abnormalities on inspection of external nose and ears tm clear  OP : no lesion edema or exudate  1-2 + rd post ph wall no edema no petechia  NECK: no obvious masses on inspection palpation supple  LUNGS: clear to auscultation bilaterally, no wheezes, rales or rhonchi, good air movement CV: HRRR, no clubbing cyanosis or  peripheral edema nl cap refill  MS: moves all extremities without noticeable focal  abnormality PSYCH: pleasant and cooperative, no obvious depression or anxiety  ASSESSMENT AND PLAN:  Discussed the following assessment and plan:  Acute pharyngitis, unspecified etiology - prob viral flu like exp managment  cultre pending sx rx over weekend delay flu vaccine until fever gone  Sore throat - Plan: POC Rapid Strep A, Throat culture Destiny Carpenter([facility])  -Patient advised to return or notify health care team  if symptoms worsen ,persist or new concerns arise.  Patient Instructions  This acts viral   Will let you know when strep cultures   Are back .  Contact us  If persistant   Fever   . Could turn into cold like sx   Fu if needed      [place] K. Iyan Flett M.D.

## 2015-06-12 NOTE — Patient Instructions (Signed)
This acts viral   Will let you know when strep cultures   Are back .  Contact us  If persistant   Fever   . Could turn into cold like sx   Fu if needed

## 2015-06-14 LAB — CULTURE, GROUP A STREP: Organism ID, Bacteria: NORMAL

## 2015-06-30 ENCOUNTER — Ambulatory Visit (INDEPENDENT_AMBULATORY_CARE_PROVIDER_SITE_OTHER): Payer: BLUE CROSS/BLUE SHIELD

## 2015-06-30 DIAGNOSIS — Z23 Encounter for immunization: Secondary | ICD-10-CM | POA: Diagnosis not present

## 2016-06-15 ENCOUNTER — Ambulatory Visit (INDEPENDENT_AMBULATORY_CARE_PROVIDER_SITE_OTHER): Payer: BLUE CROSS/BLUE SHIELD

## 2016-06-15 DIAGNOSIS — Z23 Encounter for immunization: Secondary | ICD-10-CM | POA: Diagnosis not present

## 2016-06-21 ENCOUNTER — Telehealth: Payer: Self-pay | Admitting: Internal Medicine

## 2016-06-21 NOTE — Telephone Encounter (Signed)
Pt would like to have her CPE done this year is it okay to put two SD together to do so?

## 2016-06-21 NOTE — Telephone Encounter (Signed)
Pt scheduled  

## 2016-06-21 NOTE — Telephone Encounter (Signed)
Feel free to use a Tuesday or Wednesday.

## 2016-07-04 ENCOUNTER — Other Ambulatory Visit (INDEPENDENT_AMBULATORY_CARE_PROVIDER_SITE_OTHER): Payer: BLUE CROSS/BLUE SHIELD

## 2016-07-04 DIAGNOSIS — Z0000 Encounter for general adult medical examination without abnormal findings: Secondary | ICD-10-CM

## 2016-07-04 LAB — LIPID PANEL
Cholesterol: 167 mg/dL (ref 0–200)
HDL: 89.6 mg/dL (ref >39.00)
LDL CALC: 61 mg/dL (ref 0–99)
NONHDL: 77.31
Total CHOL/HDL Ratio: 2
Triglycerides: 84 mg/dL (ref 0.0–149.0)
VLDL: 16.8 mg/dL (ref 0.0–40.0)

## 2016-07-04 LAB — CBC WITH DIFFERENTIAL/PLATELET
BASOS ABS: 0 10*3/uL (ref 0.0–0.1)
Basophils Relative: 0.6 % (ref 0.0–3.0)
Eosinophils Absolute: 0.1 10*3/uL (ref 0.0–0.7)
Eosinophils Relative: 2.1 % (ref 0.0–5.0)
HCT: 38.9 % (ref 36.0–46.0)
HEMOGLOBIN: 13.2 g/dL (ref 12.0–15.0)
LYMPHS ABS: 1 10*3/uL (ref 0.7–4.0)
LYMPHS PCT: 18 % (ref 12.0–46.0)
MCHC: 33.8 g/dL (ref 30.0–36.0)
MCV: 90.9 fl (ref 78.0–100.0)
MONOS PCT: 7.8 % (ref 3.0–12.0)
Monocytes Absolute: 0.4 10*3/uL (ref 0.1–1.0)
NEUTROS PCT: 71.5 % (ref 43.0–77.0)
Neutro Abs: 4.1 10*3/uL (ref 1.4–7.7)
Platelets: 204 10*3/uL (ref 150.0–400.0)
RBC: 4.28 Mil/uL (ref 3.87–5.11)
RDW: 14 % (ref 11.5–15.5)
WBC: 5.8 10*3/uL (ref 4.0–10.5)

## 2016-07-04 LAB — BASIC METABOLIC PANEL
BUN: 10 mg/dL (ref 6–23)
CALCIUM: 9.8 mg/dL (ref 8.4–10.5)
CO2: 31 mEq/L (ref 19–32)
Chloride: 102 mEq/L (ref 96–112)
Creatinine, Ser: 0.89 mg/dL (ref 0.40–1.20)
GFR: 70.37 mL/min (ref >60.00)
GLUCOSE: 83 mg/dL (ref 70–99)
Potassium: 4.5 mEq/L (ref 3.5–5.1)
SODIUM: 140 meq/L (ref 135–145)

## 2016-07-04 LAB — HEPATIC FUNCTION PANEL
ALBUMIN: 4.6 g/dL (ref 3.5–5.2)
ALK PHOS: 41 U/L (ref 39–117)
ALT: 22 U/L (ref 0–35)
AST: 25 U/L (ref 0–37)
Bilirubin, Direct: 0.2 mg/dL (ref 0.0–0.3)
Total Bilirubin: 0.9 mg/dL (ref 0.2–1.2)
Total Protein: 6.5 g/dL (ref 6.0–8.3)

## 2016-07-04 LAB — TSH: TSH: 1.4 u[IU]/mL (ref 0.35–4.50)

## 2016-07-06 ENCOUNTER — Other Ambulatory Visit: Payer: Self-pay | Admitting: Internal Medicine

## 2016-07-06 DIAGNOSIS — Z1211 Encounter for screening for malignant neoplasm of colon: Secondary | ICD-10-CM

## 2016-07-06 NOTE — Progress Notes (Signed)
Referral requested for routine screen

## 2016-07-12 ENCOUNTER — Encounter: Payer: Self-pay | Admitting: Internal Medicine

## 2016-07-12 ENCOUNTER — Ambulatory Visit (INDEPENDENT_AMBULATORY_CARE_PROVIDER_SITE_OTHER): Payer: BLUE CROSS/BLUE SHIELD | Admitting: Internal Medicine

## 2016-07-12 VITALS — BP 116/68 | Temp 97.6°F | Ht 67.25 in | Wt 124.4 lb

## 2016-07-12 DIAGNOSIS — Z5320 Procedure and treatment not carried out because of patient's decision for unspecified reasons: Secondary | ICD-10-CM

## 2016-07-12 DIAGNOSIS — Z1211 Encounter for screening for malignant neoplasm of colon: Secondary | ICD-10-CM | POA: Diagnosis not present

## 2016-07-12 DIAGNOSIS — Z0000 Encounter for general adult medical examination without abnormal findings: Secondary | ICD-10-CM

## 2016-07-12 NOTE — Patient Instructions (Addendum)
Your blood work is normal continue healthy eating and exercise. Pap smears are done every 3-5 years to screen for cervical cancer. Check with your insurance to see if they reimburse for cologuard colon cancer screening.   Contact us either way we will either have you do the cologuard if covered by your insurance or the yearly stool cards and we will send in an order and will come in and  pick up the cards. The biggest thing with screening is to just do something.  Review of record.    You are due for a Pap smear next year .   Health Maintenance, Female Introduction Adopting a healthy lifestyle and getting preventive care can go a long way to promote health and wellness. Talk with your health care provider about what schedule of regular examinations is right for you. This is a good chance for you to check in with your provider about disease prevention and staying healthy. In between checkups, there are plenty of things you can do on your own. Experts have done a lot of research about which lifestyle changes and preventive measures are most likely to keep you healthy. Ask your health care provider for more information. Weight and diet Eat a healthy diet  Be sure to include plenty of vegetables, fruits, low-fat dairy products, and lean protein.  Do not eat a lot of foods high in solid fats, added sugars, or salt.  Get regular exercise. This is one of the most important things you can do for your health.  Most adults should exercise for at least 150 minutes each week. The exercise should increase your heart rate and make you sweat (moderate-intensity exercise).  Most adults should also do strengthening exercises at least twice a week. This is in addition to the moderate-intensity exercise. Maintain a healthy weight  Body mass index (BMI) is a measurement that can be used to identify possible weight problems. It estimates body fat based on height and weight. Your health care provider can help  determine your BMI and help you achieve or maintain a healthy weight.  For females 64 years of age and older:  A BMI below 18.5 is considered underweight.  A BMI of 18.5 to 24.9 is normal.  A BMI of 25 to 29.9 is considered overweight.  A BMI of 30 and above is considered obese. Watch levels of cholesterol and blood lipids  You should start having your blood tested for lipids and cholesterol at 53 years of age, then have this test every 5 years.  You may need to have your cholesterol levels checked more often if:  Your lipid or cholesterol levels are high.  You are older than 53 years of age.  You are at high risk for heart disease. Cancer screening Lung Cancer  Lung cancer screening is recommended for adults 57-58 years old who are at high risk for lung cancer because of a history of smoking.  A yearly low-dose CT scan of the lungs is recommended for people who:  Currently smoke.  Have quit within the past 15 years.  Have at least a 30-pack-year history of smoking. A pack year is smoking an average of one pack of cigarettes a day for 1 year.  Yearly screening should continue until it has been 15 years since you quit.  Yearly screening should stop if you develop a health problem that would prevent you from having lung cancer treatment. Breast Cancer  Practice breast self-awareness. This means understanding how your breasts normally  appear and feel.  It also means doing regular breast self-exams. Let your health care provider know about any changes, no matter how small.  If you are in your 20s or 30s, you should have a clinical breast exam (CBE) by a health care provider every 1-3 years as part of a regular health exam.  If you are 25 or older, have a CBE every year. Also consider having a breast X-ray (mammogram) every year.  If you have a family history of breast cancer, talk to your health care provider about genetic screening.  If you are at high risk for breast  cancer, talk to your health care provider about having an MRI and a mammogram every year.  Breast cancer gene (BRCA) assessment is recommended for women who have family members with BRCA-related cancers. BRCA-related cancers include:  Breast.  Ovarian.  Tubal.  Peritoneal cancers.  Results of the assessment will determine the need for genetic counseling and BRCA1 and BRCA2 testing. Cervical Cancer  Your health care provider may recommend that you be screened regularly for cancer of the pelvic organs (ovaries, uterus, and vagina). This screening involves a pelvic examination, including checking for microscopic changes to the surface of your cervix (Pap test). You may be encouraged to have this screening done every 3 years, beginning at age 51.  For women ages 71-65, health care providers may recommend pelvic exams and Pap testing every 3 years, or they may recommend the Pap and pelvic exam, combined with testing for human papilloma virus (HPV), every 5 years. Some types of HPV increase your risk of cervical cancer. Testing for HPV may also be done on women of any age with unclear Pap test results.  Other health care providers may not recommend any screening for nonpregnant women who are considered low risk for pelvic cancer and who do not have symptoms. Ask your health care provider if a screening pelvic exam is right for you.  If you have had past treatment for cervical cancer or a condition that could lead to cancer, you need Pap tests and screening for cancer for at least 20 years after your treatment. If Pap tests have been discontinued, your risk factors (such as having a new sexual partner) need to be reassessed to determine if screening should resume. Some women have medical problems that increase the chance of getting cervical cancer. In these cases, your health care provider may recommend more frequent screening and Pap tests. Colorectal Cancer  This type of cancer can be detected and  often prevented.  Routine colorectal cancer screening usually begins at 53 years of age and continues through 53 years of age.  Your health care provider may recommend screening at an earlier age if you have risk factors for colon cancer.  Your health care provider may also recommend using home test kits to check for hidden blood in the stool.  A small camera at the end of a tube can be used to examine your colon directly (sigmoidoscopy or colonoscopy). This is done to check for the earliest forms of colorectal cancer.  Routine screening usually begins at age 27.  Direct examination of the colon should be repeated every 5-10 years through 53 years of age. However, you may need to be screened more often if early forms of precancerous polyps or small growths are found. Skin Cancer  Check your skin from head to toe regularly.  Tell your health care provider about any new moles or changes in moles, especially if there  is a change in a mole's shape or color.  Also tell your health care provider if you have a mole that is larger than the size of a pencil eraser.  Always use sunscreen. Apply sunscreen liberally and repeatedly throughout the day.  Protect yourself by wearing long sleeves, pants, a wide-brimmed hat, and sunglasses whenever you are outside. Heart disease, diabetes, and high blood pressure  High blood pressure causes heart disease and increases the risk of stroke. High blood pressure is more likely to develop in:  People who have blood pressure in the high end of the normal range (130-139/85-89 mm Hg).  People who are overweight or obese.  People who are African American.  If you are 10-52 years of age, have your blood pressure checked every 3-5 years. If you are 26 years of age or older, have your blood pressure checked every year. You should have your blood pressure measured twice-once when you are at a hospital or clinic, and once when you are not at a hospital or clinic.  Record the average of the two measurements. To check your blood pressure when you are not at a hospital or clinic, you can use:  An automated blood pressure machine at a pharmacy.  A home blood pressure monitor.  If you are between 57 years and 60 years old, ask your health care provider if you should take aspirin to prevent strokes.  Have regular diabetes screenings. This involves taking a blood sample to check your fasting blood sugar level.  If you are at a normal weight and have a low risk for diabetes, have this test once every three years after 53 years of age.  If you are overweight and have a high risk for diabetes, consider being tested at a younger age or more often. Preventing infection Hepatitis B  If you have a higher risk for hepatitis B, you should be screened for this virus. You are considered at high risk for hepatitis B if:  You were born in a country where hepatitis B is common. Ask your health care provider which countries are considered high risk.  Your parents were born in a high-risk country, and you have not been immunized against hepatitis B (hepatitis B vaccine).  You have HIV or AIDS.  You use needles to inject street drugs.  You live with someone who has hepatitis B.  You have had sex with someone who has hepatitis B.  You get hemodialysis treatment.  You take certain medicines for conditions, including cancer, organ transplantation, and autoimmune conditions. Hepatitis C  Blood testing is recommended for:  Everyone born from [birth year] through 1963.  Anyone with known risk factors for hepatitis C. Sexually transmitted infections (STIs)  You should be screened for sexually transmitted infections (STIs) including gonorrhea and chlamydia if:  You are sexually active and are younger than 53 years of age.  You are older than 53 years of age and your health care provider tells you that you are at risk for this type of infection.  Your sexual activity  has changed since you were last screened and you are at an increased risk for chlamydia or gonorrhea. Ask your health care provider if you are at risk.  If you do not have HIV, but are at risk, it may be recommended that you take a prescription medicine daily to prevent HIV infection. This is called pre-exposure prophylaxis (PrEP). You are considered at risk if:  You are sexually active and do not regularly use  condoms or know the HIV status of your partner(s).  You take drugs by injection.  You are sexually active with a partner who has HIV. Talk with your health care provider about whether you are at high risk of being infected with HIV. If you choose to begin PrEP, you should first be tested for HIV. You should then be tested every 3 months for as long as you are taking PrEP. Pregnancy  If you are premenopausal and you may become pregnant, ask your health care provider about preconception counseling.  If you may become pregnant, take 400 to 800 micrograms (mcg) of folic acid every day.  If you want to prevent pregnancy, talk to your health care provider about birth control (contraception). Osteoporosis and menopause  Osteoporosis is a disease in which the bones lose minerals and strength with aging. This can result in serious bone fractures. Your risk for osteoporosis can be identified using a bone density scan.  If you are 67 years of age or older, or if you are at risk for osteoporosis and fractures, ask your health care provider if you should be screened.  Ask your health care provider whether you should take a calcium or vitamin D supplement to lower your risk for osteoporosis.  Menopause may have certain physical symptoms and risks.  Hormone replacement therapy may reduce some of these symptoms and risks. Talk to your health care provider about whether hormone replacement therapy is right for you. Follow these instructions at home:  Schedule regular health, dental, and eye  exams.  Stay current with your immunizations.  Do not use any tobacco products including cigarettes, chewing tobacco, or electronic cigarettes.  If you are pregnant, do not drink alcohol.  If you are breastfeeding, limit how much and how often you drink alcohol.  Limit alcohol intake to no more than 1 drink per day for nonpregnant women. One drink equals 12 ounces of beer, 5 ounces of wine, or 1 ounces of hard liquor.  Do not use street drugs.  Do not share needles.  Ask your health care provider for help if you need support or information about quitting drugs.  Tell your health care provider if you often feel depressed.  Tell your health care provider if you have ever been abused or do not feel safe at home. This information is not intended to replace advice given to you by your health care provider. Make sure you discuss any questions you have with your health care provider. Document Released: 02/28/2011 Document Revised: 01/21/2016 Document Reviewed: 05/19/2015  2017 [employer]

## 2016-07-12 NOTE — Progress Notes (Signed)
Pre visit review using our clinic review tool, if applicable. No additional management support is needed unless otherwise documented below in the visit note.  Chief Complaint  Patient presents with  . Annual Exam    HPI: Patient  Destiny Carpenter  55 y.o. comes in today for Preventive Health Care visit  Had tingling in hands   So  Took b12 and biotin and dec etoh  Doing ok  Issues with high cost hsa   No fam hx colon cancer  May not get colon for end of year so may do other   eval No bleeding  Declining mammogram cause hurts   And not inc risk . Last pap here and would like less  eval if not needed no sx  Menopausal no menses for   Over 6 months Skin surveillance Dr. Martinique. Health Maintenance  Topic Date Due  . MAMMOGRAM  03/09/2013  . PAP SMEAR  07/11/2017 (Originally 09/19/2014)  . COLONOSCOPY  07/11/2017 (Originally 12/19/2012)  . Hepatitis C Screening  07/11/2017 (Originally [birth year])  . HIV Screening  07/11/2017 (Originally [birth year]/19/1977)  . TETANUS/TDAP  03/20/2019  . INFLUENZA VACCINE  Completed   Health Maintenance Review LIFESTYLE:  Exercise:  strestching  Reg exercise  Tobacco/ETS:n Alcohol: social  Stopped  Sugar beverages: Sleep: ok Drug use: no HH of  Work: hh of 4  Perimenopausal    ROS:  GEN/ HEENT: No fever, significant weight changes sweats headaches vision problems hearing changes, CV/ PULM; No chest pain shortness of breath cough, syncope,edema  change in exercise tolerance. GI /GU: No adominal pain, vomiting, change in bowel habits. No blood in the stool. No significant GU symptoms. SKIN/HEME: ,no acute skin rashes suspicious lesions or bleeding. No lymphadenopathy, nodules, masses.  NEURO/ PSYCH:  No neurologic signs such as weakness numbness. No depression anxiety. IMM/ Allergy: No unusual infections.  Allergy .   REST of 12 system review negative except as per HPI   Past Medical History:  Diagnosis Date  . Anemia   . Family history of other  specified malignant neoplasm   . IBS (irritable bowel syndrome)   . MVP (mitral valve prolapse)   . Other and unspecified ovarian cyst   . Raynaud's disease   . UTI (lower urinary tract infection)   . Varicella     Past Surgical History:  Procedure Laterality Date  . TONSILLECTOMY      Family History  Problem Relation Age of Onset  . Hyperlipidemia    . Hypertension Father   . Melanoma    . Skin cancer      Social History   Social History  . Marital status: Married    Spouse name: N/A  . Number of children: 2  . Years of education: N/A   Social History Main Topics  . Smoking status: Never Smoker  . Smokeless tobacco: Never Used  . Alcohol use Yes     Comment: 2 glasses of wine per week  . Drug use: No  . Sexual activity: Not Asked   Other Topics Concern  . 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   No facility-administered medications prior to visit.      EXAM:  BP 116/68 (BP Location: Right Arm, Patient Position: Sitting, Cuff Size:  Normal)   Temp 97.6 F (36.4 C) (Oral)   Ht 5' 7.25" (1.708 m)   Wt 124 lb 6.4 oz (56.4 kg)   LMP 01/28/2016 (Within Weeks)   BMI 19.34 kg/m   Body mass index is 19.34 kg/m.  Physical Exam: Vital signs reviewed PPG:FQMK is a well-developed well-nourished alert cooperative    who appearsr stated age in no acute distress.  HEENT: normocephalic atraumatic , Eyes: PERRL EOM's full, conjunctiva clear, Nares: paten,t no deformity discharge or tenderness., Ears: no deformity EAC's clear TMs with normal landmarks. Mouth: clear OP, no lesions, edema.  Moist mucous membranes. Dentition in adequate repair. NECK: supple without masses, thyromegaly or bruits. CHEST/PULM:  Clear to auscultation and percussion breath sounds equal no wheeze , rales or rhonchi. No chest wall  deformities or tenderness.Breast: normal by inspection . No dimpling, discharge, masses, tenderness or discharge . CV: PMI is nondisplaced, S1 S2 no gallops, murmurs, rubs. Peripheral pulses are full without delay.No JVD .  ABDOMEN: Bowel sounds normal nontender  No guard or rebound, no hepato splenomegal no CVA tenderness.  No hernia. Extremtities:  No clubbing cyanosis or edema, no acute joint swelling or redness no focal atrophy NEURO:  Oriented x3, cranial nerves 3-12 appear to be intact, no obvious focal weakness,gait within normal limits no abnormal reflexes or asymmetrical SKIN: No acute rashes normal turgor, color, no bruising or petechiae. PSYCH: Oriented, good eye contact, no obvious depression anxiety, cognition and judgment appear normal. LN: no cervical axillary inguinal adenopathy  Lab Results  Component Value Date   WBC 5.8 07/04/2016   HGB 13.2 07/04/2016   HCT 38.9 07/04/2016   PLT 204.0 07/04/2016   GLUCOSE 83 07/04/2016   CHOL 167 07/04/2016   TRIG 84.0 07/04/2016   HDL 89.60 07/04/2016   LDLCALC 61 07/04/2016   ALT 22 07/04/2016   AST 25 07/04/2016   NA 140 07/04/2016   K 4.5 07/04/2016   CL 102 07/04/2016   CREATININE 0.89 07/04/2016   BUN 10 07/04/2016   CO2 31 07/04/2016   TSH 1.40 07/04/2016    ASSESSMENT AND PLAN:  Discussed the following assessment and plan:  Visit for preventive health examination  Mammogram declined  Colon cancer screening - still workin on it financial barriers  optinos see text Already referred for colon cancer screen colonoscopy but cant get in before end of year  Discussed other options. She will check with her insurance if they cover cologuard. Paper sign she will call us back whether to do cologuard or put in orders for eye follow-up. She declines mammogram. Plan Pap smear pelvic exam next year. She would like to not do to much testing. Patient Care Team: Burnis Medin, MD as PCP - General Amy Martinique, MD as Consulting  Physician (Dermatology) Lelon Perla, MD (Cardiology) Patient Instructions  Your blood work is normal continue healthy eating and exercise. Pap smears are done every 3-5 years to screen for cervical cancer. Check with your insurance to see if they reimburse for cologuard colon cancer screening.   Contact us either way we will either have you do the cologuard if covered by your insurance or the yearly stool cards and we will send in an order and will come in and  pick up the cards. The biggest thing with screening is to just do something.  Review of record.    You are due for a Pap smear next year .   Health Maintenance, Female Introduction Adopting a healthy lifestyle  and getting preventive care can go a long way to promote health and wellness. Talk with your health care provider about what schedule of regular examinations is right for you. This is a good chance for you to check in with your provider about disease prevention and staying healthy. In between checkups, there are plenty of things you can do on your own. Experts have done a lot of research about which lifestyle changes and preventive measures are most likely to keep you healthy. Ask your health care provider for more information. Weight and diet Eat a healthy diet  Be sure to include plenty of vegetables, fruits, low-fat dairy products, and lean protein.  Do not eat a lot of foods high in solid fats, added sugars, or salt.  Get regular exercise. This is one of the most important things you can do for your health.  Most adults should exercise for at least 150 minutes each week. The exercise should increase your heart rate and make you sweat (moderate-intensity exercise).  Most adults should also do strengthening exercises at least twice a week. This is in addition to the moderate-intensity exercise. Maintain a healthy weight  Body mass index (BMI) is a measurement that can be used to identify possible weight problems. It  estimates body fat based on height and weight. Your health care provider can help determine your BMI and help you achieve or maintain a healthy weight.  For females 9 years of age and older:  A BMI below 18.5 is considered underweight.  A BMI of 18.5 to 24.9 is normal.  A BMI of 25 to 29.9 is considered overweight.  A BMI of 30 and above is considered obese. Watch levels of cholesterol and blood lipids  You should start having your blood tested for lipids and cholesterol at 53 years of age, then have this test every 5 years.  You may need to have your cholesterol levels checked more often if:  Your lipid or cholesterol levels are high.  You are older than 53 years of age.  You are at high risk for heart disease. Cancer screening Lung Cancer  Lung cancer screening is recommended for adults 93-74 years old who are at high risk for lung cancer because of a history of smoking.  A yearly low-dose CT scan of the lungs is recommended for people who:  Currently smoke.  Have quit within the past 15 years.  Have at least a 30-pack-year history of smoking. A pack year is smoking an average of one pack of cigarettes a day for 1 year.  Yearly screening should continue until it has been 15 years since you quit.  Yearly screening should stop if you develop a health problem that would prevent you from having lung cancer treatment. Breast Cancer  Practice breast self-awareness. This means understanding how your breasts normally appear and feel.  It also means doing regular breast self-exams. Let your health care provider know about any changes, no matter how small.  If you are in your 20s or 30s, you should have a clinical breast exam (CBE) by a health care provider every 1-3 years as part of a regular health exam.  If you are 54 or older, have a CBE every year. Also consider having a breast X-ray (mammogram) every year.  If you have a family history of breast cancer, talk to your  health care provider about genetic screening.  If you are at high risk for breast cancer, talk to your health care provider about  having an MRI and a mammogram every year.  Breast cancer gene (BRCA) assessment is recommended for women who have family members with BRCA-related cancers. BRCA-related cancers include:  Breast.  Ovarian.  Tubal.  Peritoneal cancers.  Results of the assessment will determine the need for genetic counseling and BRCA1 and BRCA2 testing. Cervical Cancer  Your health care provider may recommend that you be screened regularly for cancer of the pelvic organs (ovaries, uterus, and vagina). This screening involves a pelvic examination, including checking for microscopic changes to the surface of your cervix (Pap test). You may be encouraged to have this screening done every 3 years, beginning at age 55.  For women ages 57-65, health care providers may recommend pelvic exams and Pap testing every 3 years, or they may recommend the Pap and pelvic exam, combined with testing for human papilloma virus (HPV), every 5 years. Some types of HPV increase your risk of cervical cancer. Testing for HPV may also be done on women of any age with unclear Pap test results.  Other health care providers may not recommend any screening for nonpregnant women who are considered low risk for pelvic cancer and who do not have symptoms. Ask your health care provider if a screening pelvic exam is right for you.  If you have had past treatment for cervical cancer or a condition that could lead to cancer, you need Pap tests and screening for cancer for at least 20 years after your treatment. If Pap tests have been discontinued, your risk factors (such as having a new sexual partner) need to be reassessed to determine if screening should resume. Some women have medical problems that increase the chance of getting cervical cancer. In these cases, your health care provider may recommend more frequent  screening and Pap tests. Colorectal Cancer  This type of cancer can be detected and often prevented.  Routine colorectal cancer screening usually begins at 53 years of age and continues through 53 years of age.  Your health care provider may recommend screening at an earlier age if you have risk factors for colon cancer.  Your health care provider may also recommend using home test kits to check for hidden blood in the stool.  A small camera at the end of a tube can be used to examine your colon directly (sigmoidoscopy or colonoscopy). This is done to check for the earliest forms of colorectal cancer.  Routine screening usually begins at age 48.  Direct examination of the colon should be repeated every 5-10 years through 53 years of age. However, you may need to be screened more often if early forms of precancerous polyps or small growths are found. Skin Cancer  Check your skin from head to toe regularly.  Tell your health care provider about any new moles or changes in moles, especially if there is a change in a mole's shape or color.  Also tell your health care provider if you have a mole that is larger than the size of a pencil eraser.  Always use sunscreen. Apply sunscreen liberally and repeatedly throughout the day.  Protect yourself by wearing long sleeves, pants, a wide-brimmed hat, and sunglasses whenever you are outside. Heart disease, diabetes, and high blood pressure  High blood pressure causes heart disease and increases the risk of stroke. High blood pressure is more likely to develop in:  People who have blood pressure in the high end of the normal range (130-139/85-89 mm Hg).  People who are overweight or obese.  People who are African American.  If you are 67-71 years of age, have your blood pressure checked every 3-5 years. If you are 29 years of age or older, have your blood pressure checked every year. You should have your blood pressure measured twice-once  when you are at a hospital or clinic, and once when you are not at a hospital or clinic. Record the average of the two measurements. To check your blood pressure when you are not at a hospital or clinic, you can use:  An automated blood pressure machine at a pharmacy.  A home blood pressure monitor.  If you are between 70 years and 45 years old, ask your health care provider if you should take aspirin to prevent strokes.  Have regular diabetes screenings. This involves taking a blood sample to check your fasting blood sugar level.  If you are at a normal weight and have a low risk for diabetes, have this test once every three years after 53 years of age.  If you are overweight and have a high risk for diabetes, consider being tested at a younger age or more often. Preventing infection Hepatitis B  If you have a higher risk for hepatitis B, you should be screened for this virus. You are considered at high risk for hepatitis B if:  You were born in a country where hepatitis B is common. Ask your health care provider which countries are considered high risk.  Your parents were born in a high-risk country, and you have not been immunized against hepatitis B (hepatitis B vaccine).  You have HIV or AIDS.  You use needles to inject street drugs.  You live with someone who has hepatitis B.  You have had sex with someone who has hepatitis B.  You get hemodialysis treatment.  You take certain medicines for conditions, including cancer, organ transplantation, and autoimmune conditions. Hepatitis C  Blood testing is recommended for:  Everyone born from [birth year] through 1963.  Anyone with known risk factors for hepatitis C. Sexually transmitted infections (STIs)  You should be screened for sexually transmitted infections (STIs) including gonorrhea and chlamydia if:  You are sexually active and are younger than 53 years of age.  You are older than 53 years of age and your health care  provider tells you that you are at risk for this type of infection.  Your sexual activity has changed since you were last screened and you are at an increased risk for chlamydia or gonorrhea. Ask your health care provider if you are at risk.  If you do not have HIV, but are at risk, it may be recommended that you take a prescription medicine daily to prevent HIV infection. This is called pre-exposure prophylaxis (PrEP). You are considered at risk if:  You are sexually active and do not regularly use condoms or know the HIV status of your partner(s).  You take drugs by injection.  You are sexually active with a partner who has HIV. Talk with your health care provider about whether you are at high risk of being infected with HIV. If you choose to begin PrEP, you should first be tested for HIV. You should then be tested every 3 months for as long as you are taking PrEP. Pregnancy  If you are premenopausal and you may become pregnant, ask your health care provider about preconception counseling.  If you may become pregnant, take 400 to 800 micrograms (mcg) of folic acid every day.  If you want to  prevent pregnancy, talk to your health care provider about birth control (contraception). Osteoporosis and menopause  Osteoporosis is a disease in which the bones lose minerals and strength with aging. This can result in serious bone fractures. Your risk for osteoporosis can be identified using a bone density scan.  If you are 68 years of age or older, or if you are at risk for osteoporosis and fractures, ask your health care provider if you should be screened.  Ask your health care provider whether you should take a calcium or vitamin D supplement to lower your risk for osteoporosis.  Menopause may have certain physical symptoms and risks.  Hormone replacement therapy may reduce some of these symptoms and risks. Talk to your health care provider about whether hormone replacement therapy is right  for you. Follow these instructions at home:  Schedule regular health, dental, and eye exams.  Stay current with your immunizations.  Do not use any tobacco products including cigarettes, chewing tobacco, or electronic cigarettes.  If you are pregnant, do not drink alcohol.  If you are breastfeeding, limit how much and how often you drink alcohol.  Limit alcohol intake to no more than 1 drink per day for nonpregnant women. One drink equals 12 ounces of beer, 5 ounces of wine, or 1 ounces of hard liquor.  Do not use street drugs.  Do not share needles.  Ask your health care provider for help if you need support or information about quitting drugs.  Tell your health care provider if you often feel depressed.  Tell your health care provider if you have ever been abused or do not feel safe at home. This information is not intended to replace advice given to you by your health care provider. Make sure you discuss any questions you have with your health care provider. Document Released: 02/28/2011 Document Revised: 01/21/2016 Document Reviewed: 05/19/2015  2017 Elsevier    Mariann Laster K. Panosh M.D.

## 2016-07-13 ENCOUNTER — Telehealth: Payer: Self-pay | Admitting: Internal Medicine

## 2016-07-13 NOTE — Telephone Encounter (Signed)
° ° °  Pt call to say insurance company need a CPT code to determine if they pay for the colon guard test the patient would like a call back

## 2016-07-14 NOTE — Telephone Encounter (Signed)
Left a message for a return call on home and cell.  ICD-10 codes needed are Z12.11 and Z12.12.  Pt needs to be notified.

## 2016-07-14 NOTE — Telephone Encounter (Signed)
[facility]  But the paperwork must state  "medically necessary" and for "preventative wellness"  Also, husband will come by tomorrow morning and sign his form.

## 2016-07-14 NOTE — Telephone Encounter (Signed)
Pt is aware of codes.

## 2016-07-15 NOTE — Telephone Encounter (Signed)
Noted.  [facility] Cologu[facility].

## 2016-07-26 LAB — COLOGUARD

## 2016-08-05 ENCOUNTER — Encounter: Payer: Self-pay | Admitting: Internal Medicine

## 2016-08-08 ENCOUNTER — Encounter: Payer: Self-pay | Admitting: Family Medicine

## 2016-08-30 ENCOUNTER — Encounter: Payer: Self-pay | Admitting: Internal Medicine

## 2016-09-19 ENCOUNTER — Other Ambulatory Visit: Payer: BLUE CROSS/BLUE SHIELD

## 2016-10-03 ENCOUNTER — Encounter: Payer: BLUE CROSS/BLUE SHIELD | Admitting: Internal Medicine

## 2016-12-02 ENCOUNTER — Ambulatory Visit (INDEPENDENT_AMBULATORY_CARE_PROVIDER_SITE_OTHER): Payer: BLUE CROSS/BLUE SHIELD | Admitting: Internal Medicine

## 2016-12-02 ENCOUNTER — Encounter: Payer: Self-pay | Admitting: Internal Medicine

## 2016-12-02 VITALS — BP 110/78 | HR 70 | Temp 98.2°F | Ht 67.25 in | Wt 127.4 lb

## 2016-12-02 DIAGNOSIS — H9202 Otalgia, left ear: Secondary | ICD-10-CM

## 2016-12-02 DIAGNOSIS — R5383 Other fatigue: Secondary | ICD-10-CM

## 2016-12-02 DIAGNOSIS — R599 Enlarged lymph nodes, unspecified: Secondary | ICD-10-CM | POA: Diagnosis not present

## 2016-12-02 LAB — CBC WITH DIFFERENTIAL/PLATELET
BASOS PCT: 1 %
Basophils Absolute: 65 cells/uL (ref 0–200)
Eosinophils Absolute: 195 cells/uL (ref 15–500)
Eosinophils Relative: 3 %
HEMATOCRIT: 40.5 % (ref 35.0–45.0)
Hemoglobin: 13 g/dL (ref 11.7–15.5)
LYMPHS PCT: 18 %
Lymphs Abs: 1170 cells/uL (ref 850–3900)
MCH: 29.6 pg (ref 27.0–33.0)
MCHC: 32.1 g/dL (ref 32.0–36.0)
MCV: 92.3 fL (ref 80.0–100.0)
MONO ABS: 650 {cells}/uL (ref 200–950)
MPV: 10 fL (ref 7.5–12.5)
Monocytes Relative: 10 %
NEUTROS ABS: 4420 {cells}/uL (ref 1500–7800)
Neutrophils Relative %: 68 %
PLATELETS: 227 10*3/uL (ref 140–400)
RBC: 4.39 MIL/uL (ref 3.80–5.10)
RDW: 13.5 % (ref 11.0–15.0)
WBC: 6.5 10*3/uL (ref 3.8–10.8)

## 2016-12-02 NOTE — Patient Instructions (Signed)
This could be a viral infection of your glands that is going to improve with time. It appears that your salivary glands are prominent but no firm masses. You also have some small glands in her neck but they may not be of clinical significance and could be a resolving viral infection. Check blood count and serology today We are checking for a few viral illnesses that should get better on their own. If you are having persistent progressive symptoms in regard to your ear and concerns about your neck glands we will get a referral to ear nose and throat have them do a very good exam get their opinion. Your eardrum is normal you can get referred pain from clogged eustachian tube with congestion and even jaw joint pain with clenching and even when you get pain in your throat. I don't see anything  in your ear today. To treat differently  But avoid teeth clenching .

## 2016-12-02 NOTE — Progress Notes (Signed)
Chief Complaint  Patient presents with  . Adenopathy    started 3 or 4 weeks ago  . Fatigue    HPI: Destiny Carpenter 56 y.o.  SDA noted minor st when husband was sick  w a cold  And since then noted what she thinks are swollen glands  Not tender   And persisting   No fever pos fatigue  left tear pain at times  Like a boil  Does clench jaw is perimenopausal  Some  Hearing  Heart beat at night .  Says she had mono as a teen  Neg hiv los risk since   ROS: See pertinent positives and negatives per HPI. No fever  Chills after meals not new   ? Should she be worried ?  Past Medical History:  Diagnosis Date  . Anemia   . Family history of other specified malignant neoplasm   . IBS (irritable bowel syndrome)   . MVP (mitral valve prolapse)   . Other and unspecified ovarian cyst   . Raynaud's disease   . UTI (lower urinary tract infection)   . Varicella     Family History  Problem Relation Age of Onset  . Hyperlipidemia    . Hypertension Father   . Melanoma    . Skin cancer      Social History   Social History  . Marital status: Married    Spouse name: N/A  . Number of children: 2  . Years of education: N/A   Social History Main Topics  . Smoking status: Never Smoker  . Smokeless tobacco: Never Used  . Alcohol use Yes     Comment: 2 glasses of wine per week  . Drug use: No  . Sexual activity: Not Asked   Other Topics Concern  . 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   No facility-administered medications prior to visit.      EXAM:  BP 110/78 (BP Location: Right Arm, Patient Position: Sitting, Cuff Size: Normal)   Pulse 70   Temp 98.2 F (36.8 C) (Oral)   Ht 5' 7.25" (1.708 m)   Wt 127 lb 6.4 oz (57.8 kg)   BMI 19.81 kg/m   Body mass index is 19.81 kg/m.  GENERAL: vitals reviewed and  listed above, alert, oriented, appears well hydrated and in no acute distress HEENT: atraumatic, conjunctiva  clear, no obvious abnormalities on inspection of external nose and ears  tms clear eac no lesion seen OP : no lesion edema or exudate  NECK: no obvious masses  Prominent ? Submandibular ?  And 1 cm ad nodes neg pc nodes   LN neg [place] axillary  abd no masses  LUNGS: clear to auscultation bilaterally, no wheezes, rales or rhonchi, good air movement CV: HRRR, no clubbing cyanosis or  peripheral edema nl cap refill  MS: moves all extremities without noticeable focal  abnormality PSYCH: pleasant and cooperative, no obvious depression or anxiety Lab Results  Component Value Date   WBC 5.8 07/04/2016   HGB 13.2 07/04/2016   HCT 38.9 07/04/2016   PLT 204.0 07/04/2016   GLUCOSE 83 07/04/2016   CHOL 167 07/04/2016   TRIG 84.0 07/04/2016   HDL 89.60 07/04/2016   LDLCALC 61 07/04/2016   ALT  22 07/04/2016   AST 25 07/04/2016   NA 140 07/04/2016   K 4.5 07/04/2016   CL 102 07/04/2016   CREATININE 0.89 07/04/2016   BUN 10 07/04/2016   CO2 31 07/04/2016   TSH 1.40 07/04/2016    ASSESSMENT AND PLAN:  Discussed the following assessment and plan:  Other fatigue - Plan: Epstein-Barr virus VCA antibody panel, CMV IgM, Sedimentation rate, CBC with Differential/Platelet, CANCELED: CBC with Differential/Platelet, CANCELED: Sedimentation rate  Swollen gland - Plan: Epstein-Barr virus VCA antibody panel, CMV IgM, Sedimentation rate, CANCELED: CBC with Differential/Platelet, CANCELED: Sedimentation rate  Left ear pain - Plan: Epstein-Barr virus VCA antibody panel, CMV IgM, Sedimentation rate, CANCELED: CBC with Differential/Platelet, CANCELED: Sedimentation rate It appears these might be prominent submandibular salivary glands although there is a small lymph node that is mobile and nontender. Rest of her exam is reassuring. Currently no pain with these symptoms your pain could be referred discussed  differential diagnosis. CBC EBV CMV Igm today observe if persistent or progressive consider getting ENT evaluation. This may actually be normal salivary glands for her and she had a viral infection recently. -Patient advised to return or notify health care team  if symptoms worsen ,persist or new concerns arise.  Patient Instructions  This could be a viral infection of your glands that is going to improve with time. It appears that your salivary glands are prominent but no firm masses. You also have some small glands in her neck but they may not be of clinical significance and could be a resolving viral infection. Check blood count and serology today We are checking for a few viral illnesses that should get better on their own. If you are having persistent progressive symptoms in regard to your ear and concerns about your neck glands we will get a referral to ear nose and throat have them do a very good exam get their opinion. Your eardrum is normal you can get referred pain from clogged eustachian tube with congestion and even jaw joint pain with clenching and even when you get pain in your throat. I don't see anything  in your ear today. To treat differently  But avoid teeth clenching .     Neta Mends. Jaelen Soth M.D.

## 2016-12-03 LAB — SEDIMENTATION RATE: Sed Rate: 1 mm/hr (ref 0–30)

## 2016-12-05 LAB — EPSTEIN-BARR VIRUS VCA ANTIBODY PANEL
EBV NA IGG: 554 U/mL — AB
EBV VCA IGG: 536 U/mL — AB
EBV VCA IgM: <36 U/mL

## 2016-12-06 LAB — CMV IGM

## 2016-12-09 ENCOUNTER — Telehealth: Payer: Self-pay | Admitting: Internal Medicine

## 2016-12-09 NOTE — Telephone Encounter (Signed)
° °  Pt call to say she would a call back concerning her lab results

## 2016-12-12 NOTE — Telephone Encounter (Signed)
Pt has spoken to [facility] about lab results nothing further needed

## 2017-02-02 ENCOUNTER — Telehealth: Payer: Self-pay | Admitting: *Deleted

## 2017-02-02 NOTE — Telephone Encounter (Signed)
Received medical clearance for dental treatment form. Antibiotic prophylaxis- no Interruption of anticoagulants- pt is not on anticoag Anesthetic restrictions- no Is epinephrine ok? Yes  Form faxed to the number provided.

## 2017-04-18 ENCOUNTER — Telehealth: Payer: Self-pay | Admitting: Internal Medicine

## 2017-04-18 NOTE — Telephone Encounter (Signed)
Please advise 

## 2017-04-18 NOTE — Telephone Encounter (Signed)
Pt now has swollen lymph nodes in arm pit and groin area now. Please advise. Pt was seen in April 2018. Pt was offered an appt and decline. Pt would like to know if md wants her to go to another doctor

## 2017-04-18 NOTE — Telephone Encounter (Signed)
If only in neck will do ent referral but since she is  having new   Areas of concern outside of the neck area need office visit to decide next step.

## 2017-04-19 NOTE — Progress Notes (Signed)
No chief complaint on file.   HPI: Destiny Carpenter 56 y.o.   concern aobut  Asymptomatic swelling in right groin and  Axilla  See last note  With neck sx neg evaluation  And phone note  In setting of  Back pain strain and [place]  Doing exercise and doing better   After gardening in April recently    Noted asymmetry  Right groin area when standing in shower  And thought no pain ? If  Abnormal  And then Friday  When  Shaving asymmetry and then   lump area ,    Right arm non tender  May have been there for a while no change.  No fever weight loss  Sweating prob from menopause   ROS: See pertinent positives and negatives per HPI. Some stress family teenager at home but no acute changes .  Sees dr Rexene Edison every few years for MVp now off  sbe prophylaxis  Ortho said she may need bone density no dx.     Past Medical History:  Diagnosis Date  . Anemia   . Family history of other specified malignant neoplasm   . IBS (irritable bowel syndrome)   . MVP (mitral valve prolapse)   . Other and unspecified ovarian cyst   . Raynaud's disease   . UTI (lower urinary tract infection)   . Varicella     Family History  Problem Relation Age of Onset  . Hyperlipidemia Unknown   . Hypertension Father   . Melanoma Unknown   . Skin cancer Unknown     Social History   Social History  . Marital status: Married    Spouse name: N/A  . Number of children: 2  . Years of education: N/A   Social History Main Topics  . Smoking status: Never Smoker  . Smokeless tobacco: Never Used  . Alcohol use Yes     Comment: 2 glasses of wine per week  . Drug use: No  . Sexual activity: Not Asked   Other Topics Concern  . None   Social History Narrative   Married   Futures trader   HH of 4   Pet Car   PhD Math   Part time not working now   husband Art gallery manager self employed    Outpatient Medications Prior to Visit  Medication Sig Dispense Refill  . Cholecalciferol (VITAMIN D PO) Take 400 mg by mouth.    .  CYANOCOBALAMIN PO Take by mouth.     No facility-administered medications prior to visit.      EXAM:  BP 110/80 (BP Location: Right Arm, Patient Position: Sitting, Cuff Size: Normal)   Pulse 60   Temp 97.9 F (36.6 C) (Oral)   Wt 123 lb (55.8 kg)   BMI 19.12 kg/m   Body mass index is 19.12 kg/m.  GENERAL: vitals reviewed and listed above, alert, oriented, appears well hydrated and in no acute distress HEENT: atraumatic, conjunctiva  clear, no obvious abnormalities on inspection of external nose and ears OP : no lesion edema or exudate  NECK: no obvious masses on inspection palpation   Symmetrical ac area  Non tender  LUNGS: clear to auscultation bilaterally, no wheezes, rales or rhonchi, good air movement CV: HRRR, ocass mid sys click  no clubbing cyanosis or  peripheral edema nl cap refill  Abdomen:  Sof,t normal bowel sounds without hepatosplenomegaly, no guarding rebound or masses no CVA tenderness Right axilla soft 1 cm cystic   Round to ovoid  Mobile iarea in arm area proxima neal axilla no skin changes   Slightly fulll no   Other abnormality  Groin assymmmetric swelling just aobe inguinal ligament area  Linear  2-3 cm    Mor palp when standing than  layin g down .   MS: moves all extremities without noticeable focal  abnormality PSYCH: pleasant and cooperative, no obvious depression or anxiety Lab Results  Component Value Date   WBC 6.5 12/02/2016   HGB 13.0 12/02/2016   HCT 40.5 12/02/2016   PLT 227 12/02/2016   GLUCOSE 83 07/04/2016   CHOL 167 07/04/2016   TRIG 84.0 07/04/2016   HDL 89.60 07/04/2016   LDLCALC 61 07/04/2016   ALT 22 07/04/2016   AST 25 07/04/2016   NA 140 07/04/2016   K 4.5 07/04/2016   CL 102 07/04/2016   CREATININE 0.89 07/04/2016   BUN 10 07/04/2016   CO2 31 07/04/2016   TSH 1.40 07/04/2016      ASSESSMENT AND PLAN:  Discussed the following assessment and plan:  Swelling of inguinal region - right  subtle but consdier hernia less  likely adenopathy optinos discussed  get surgicalexam opinion.  - Plan: Ambulatory referral to General Surgery  Axillary lump, right - may be cyst and bening  as opposed to  ln soft and mobile   follow  canget surgeon to feel this area also imagining if needed [place] etc  - Plan: Ambulatory referral to General Surgery Consideration of hernia on the right groin area asymmetry swelling I don't think it is clear adenopathy. Options discussed will get a surgery consult for them to do an exam consideration of imaging if appropriate. No other alarm features Right axillary area again could be a cysticlesion probably not related to the right groin area but have the surgeon feel the area consideration of ultrasound or just follow. No systemic symptoms associated with this difficulty. And they may not be related. -Patient advised to return or notify health care team  if symptoms worsen ,persist or new concerns arise.  Patient Instructions  Heart exam  Is  Good today .  We will do    Surgery consults about  Right groin area   ? If hernia   Vs other .  Let us know if new findings .       Neta Mends. Kammy Klett M.D.

## 2017-04-19 NOTE — Telephone Encounter (Signed)
Pt scheduled  

## 2017-04-19 NOTE — Telephone Encounter (Signed)
Left a VM for patient to give the office a call back.  

## 2017-04-20 ENCOUNTER — Encounter: Payer: Self-pay | Admitting: Internal Medicine

## 2017-04-20 ENCOUNTER — Ambulatory Visit (INDEPENDENT_AMBULATORY_CARE_PROVIDER_SITE_OTHER): Payer: BLUE CROSS/BLUE SHIELD | Admitting: Internal Medicine

## 2017-04-20 VITALS — BP 110/80 | HR 60 | Temp 97.9°F | Wt 123.0 lb

## 2017-04-20 DIAGNOSIS — R2231 Localized swelling, mass and lump, right upper limb: Secondary | ICD-10-CM

## 2017-04-20 DIAGNOSIS — R1909 Other intra-abdominal and pelvic swelling, mass and lump: Secondary | ICD-10-CM | POA: Diagnosis not present

## 2017-04-20 NOTE — Patient Instructions (Addendum)
Heart exam  Is  Good today .  We will do    Surgery consults about  Right groin area   ? If hernia   Vs other .  Let us know if new findings .

## 2017-05-16 ENCOUNTER — Other Ambulatory Visit: Payer: Self-pay | Admitting: Surgery

## 2017-07-19 ENCOUNTER — Ambulatory Visit (INDEPENDENT_AMBULATORY_CARE_PROVIDER_SITE_OTHER): Payer: BLUE CROSS/BLUE SHIELD

## 2017-07-19 DIAGNOSIS — Z23 Encounter for immunization: Secondary | ICD-10-CM

## 2018-02-13 ENCOUNTER — Ambulatory Visit (INDEPENDENT_AMBULATORY_CARE_PROVIDER_SITE_OTHER): Payer: BLUE CROSS/BLUE SHIELD | Admitting: Internal Medicine

## 2018-02-13 ENCOUNTER — Encounter: Payer: Self-pay | Admitting: Internal Medicine

## 2018-02-13 VITALS — BP 86/60 | Temp 97.9°F | Wt 121.0 lb

## 2018-02-13 DIAGNOSIS — R399 Unspecified symptoms and signs involving the genitourinary system: Secondary | ICD-10-CM | POA: Diagnosis not present

## 2018-02-13 DIAGNOSIS — R6889 Other general symptoms and signs: Secondary | ICD-10-CM

## 2018-02-13 LAB — POC URINALSYSI DIPSTICK (AUTOMATED)
Bilirubin, UA: NEGATIVE
Glucose, UA: NEGATIVE
KETONES UA: NEGATIVE
Leukocytes, UA: NEGATIVE
Nitrite, UA: NEGATIVE
Protein, UA: NEGATIVE
RBC UA: NEGATIVE
SPEC GRAV UA: 1.015 (ref 1.010–1.025)
Urobilinogen, UA: 0.2 E.U./dL
pH, UA: 6 (ref 5.0–8.0)

## 2018-02-13 NOTE — Progress Notes (Signed)
Chief Complaint  Patient presents with  . Scratchy Throat  . Cold Sweats  . Fatigue    HPI: Destiny Carpenter 57 y.o.     sda with multipl concerns     Sx  Fighting something and  Glands in neck     June 12  And stress  Recently  So  Could she have some systemic infection that needs help   Burning itching discharging and treated with 3   Days of miconazile  buring and clotrimazole  June 3-5  Then a week alter did ua  Pos leuk .  Changed teas   To see if helps  No utis but not normal and not that abnofmal .  Glands and scratchy throat  ? Imagining   Leaving town on Friday .  [place].  For 11 days   June 12 .    Cleaning  with bleach and  Paints .  But no other exposures    Flushing with wine  Not in past  ROS: See pertinent positives and negatives per HPI.some hand pain no sswelling fever  Past Medical History:  Diagnosis Date  . Anemia   . Family history of other specified malignant neoplasm   . IBS (irritable bowel syndrome)   . MVP (mitral valve prolapse)   . Other and unspecified ovarian cyst   . Raynaud's disease   . UTI (lower urinary tract infection)   . Varicella     Family History  Problem Relation Age of Onset  . Hyperlipidemia Unknown   . Hypertension Father   . Melanoma Unknown   . Skin cancer Unknown     Social History   Socioeconomic History  . Marital status: Married    Spouse name: Not on file  . Number of children: 2  . Years of education: Not on file  . Highest education level: Not on file  Occupational History  . Not on file  Social Needs  . Financial resource strain: Not on file  . Food insecurity:    Worry: Not on file    Inability: Not on file  . Transportation needs:    Medical: Not on file    Non-medical: Not on file  Tobacco Use  . Smoking status: Never Smoker  . Smokeless tobacco: Never Used  Substance and Sexual Activity  . Alcohol use: Yes    Comment: 2 glasses of wine per week  . Drug use: No  . Sexual activity: Not on  file  Lifestyle  . Physical activity:    Days per week: Not on file    Minutes per session: Not on file  . Stress: Not on file  Relationships  . Social connections:    Talks on phone: Not on file    Gets together: Not on file    Attends religious service: Not on file    Active member of club or organization: Not on file    Attends meetings of clubs or organizations: Not on file    Relationship status: Not on file  Other Topics Concern  . 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 CALCIUM PO Take by mouth.    . Cholecalciferol (VITAMIN D PO) Take 400 mg by mouth.    .Marland Kitchen  CYANOCOBALAMIN PO Take by mouth.     No facility-administered medications prior to visit.      EXAM:  BP (!) 86/60   Temp 97.9 F (36.6 C) (Oral)   Wt 121 lb (54.9 kg)   BMI 18.81 kg/m   Body mass index is 18.81 kg/m.  GENERAL: vitals reviewed and listed above, alert, oriented, appears well hydrated and in no acute distress slender but not toxic HEENT: atraumatic, conjunctiva  clear, no obvious abnormalities on inspection of external nose and ears tm clear OP : no lesion edema or exudate min redness  NECK: no obvious masses on inspection  Small ac nodes no pc palpation  LUNGS: clear to auscultation bilaterally, no wheezes, rales or rhonchi, good air movement CV: HRRR, no clubbing cyanosis or  peripheral edema nl cap refill  Abdomen:  Sof,t normal bowel sounds without hepatosplenomegaly, no guarding rebound or masses no CVA tenderness  [place] inguinal area bilaterally MS: moves all extremities without noticeable focal  abnormality PSYCH: pleasant and cooperative,mildy anxious  Lab Results  Component Value Date   WBC 6.5 12/02/2016   HGB 13.0 12/02/2016   HCT 40.5 12/02/2016   PLT 227 12/02/2016   GLUCOSE 83  07/04/2016   CHOL 167 07/04/2016   TRIG 84.0 07/04/2016   HDL 89.60 07/04/2016   LDLCALC 61 07/04/2016   ALT 22 07/04/2016   AST 25 07/04/2016   NA 140 07/04/2016   K 4.5 07/04/2016   CL 102 07/04/2016   CREATININE 0.89 07/04/2016   BUN 10 07/04/2016   CO2 31 07/04/2016   TSH 1.40 07/04/2016   poct ua normal  ASSESSMENT AND PLAN:  Discussed the following assessment and plan:  Throat symptom  Urinary symptom or sign - no uti prob had yeast rx  - Plan: POCT Urinalysis Dipstick (Automated) Multiple sx but exam ok and probably number of causes  dont think the flushing with wine is  Alarming at this time Total visit > 50% spent counseling and coordinating care as indicated in above note and in instructions to patient .   Total visit > 50% spent counseling and coordinating care as indicated in above note and in instructions to patient .     -Patient advised to return or notify health care team  if symptoms worsen ,persist or new concerns arise.  Patient Instructions  Your exam is reassuring  .  Urine is normal . Checking for  Minor uti before travel .  I dont think you have a systemic infection at this time   Although could be coming down with a  Viral respiratory infection that would have to run its course .   Have  a  Good trip  You could take  pyridium if needed  If you have urinary pain .     Neta Mends. Panosh M.D.

## 2018-02-13 NOTE — Patient Instructions (Addendum)
Your exam is reassuring  .  Urine is normal . Checking for  Minor uti before travel .  I dont think you have a systemic infection at this time   Although could be coming down with a  Viral respiratory infection that would have to run its course .   Have  a  Good trip  You could take  pyridium if needed  If you have urinary pain .

## 2018-05-17 ENCOUNTER — Ambulatory Visit (INDEPENDENT_AMBULATORY_CARE_PROVIDER_SITE_OTHER): Payer: BLUE CROSS/BLUE SHIELD

## 2018-05-17 DIAGNOSIS — Z23 Encounter for immunization: Secondary | ICD-10-CM | POA: Diagnosis not present

## 2018-06-18 NOTE — Progress Notes (Signed)
Chief Complaint  Patient presents with  . Annual Exam    passed out 8 days ago, having sinus headaches, used sudafed xr that morning    HPI: Patient  Destiny Carpenter  57 y.o. comes in today for Preventive Health Care visit  Had sx  conjunc hemorrhage  And went to [facility] doc and said  Sinus congestion and used  .  sinutab    12 hours  Time released and on 3r day felt weird  And then plain guiafenesin and mountain  Trip and took dm.  Gets se .   Passed out and emt came  Low BP.    8 days ago .  Clogged ears.  Eustachian.    But no pain  Hx of headache in past  Has make appt card due for fu Dr Stanford Breed   Health Maintenance  Topic Date Due  . Hepatitis C Screening  1961/08/28  . HIV Screening  12/19/1977  . COLONOSCOPY  12/19/2012  . MAMMOGRAM  03/09/2013  . PAP SMEAR  09/19/2014  . TETANUS/TDAP  03/20/2019  . INFLUENZA VACCINE  Completed  Had Cologuard done reported as negative.  Health Maintenance Review LIFESTYLE:  Exercise:   Walks at least 40 min 4 d per week.  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  GEN/ HEENT: No fever, significant weight changes sweats headaches vision problems hearing changes, CV/ PULM; No chest pain shortness of breath cough, syncope,edema  change in exercise tolerance. GI /GU: No adominal pain, vomiting, change in bowel habits. No blood in the stool. No significant GU symptoms. SKIN/HEME: ,no acute skin rashes suspicious lesions or bleeding. No lymphadenopathy, nodules, masses.  NEURO/ PSYCH:  No neurologic signs such as weakness numbness. No depression anxiety. IMM/ Allergy: No unusual infections.  Allergy .   REST of 12 system review  negative except as per HPI   Past Medical History:  Diagnosis Date  . Anemia   . Family history of other specified malignant neoplasm   . IBS (irritable bowel syndrome)   . MVP (mitral valve prolapse)   . Other and unspecified ovarian cyst   . Raynaud's disease   . UTI (lower urinary tract infection)   . Varicella     Past Surgical History:  Procedure Laterality Date  . TONSILLECTOMY      Family History  Problem Relation Age of Onset  . Hyperlipidemia Unknown   . Hypertension Father   . Melanoma Unknown   . Skin cancer Unknown     Social History   Socioeconomic History  . Marital status: Married    Spouse name: Not on file  . Number of children: 2  . Years of education: Not on file  . Highest education level: Not on file  Occupational History  . Not on file  Social Needs  . Financial resource strain: Not on file  . Food insecurity:    Worry: Not on file    Inability: Not on file  . Transportation needs:  Medical: Not on file    Non-medical: Not on file  Tobacco Use  . Smoking status: Never Smoker  . Smokeless tobacco: Never Used  Substance and Sexual Activity  . Alcohol use: Yes    Comment: 2 glasses of wine per week  . Drug use: No  . Sexual activity: Not on file  Lifestyle  . Physical activity:    Days per week: Not on file    Minutes per session: Not on file  . Stress: Not on file  Relationships  . Social connections:    Talks on phone: Not on file    Gets together: Not on file    Attends religious service: Not on file    Active member of club or organization: Not on file    Attends meetings of clubs or organizations: Not on file    Relationship status: Not on file  Other Topics Concern  . Not on file  Social History Narrative   Married   Homemaker   HH of 3 one at [place] time not working now   husband Chief Financial Officer self employed    Outpatient Medications Prior to Visit  Medication Sig Dispense Refill  .  CALCIUM PO Take by mouth.    . Cholecalciferol (VITAMIN D PO) Take 400 mg by mouth.    . CYANOCOBALAMIN PO Take by mouth.     No facility-administered medications prior to visit.      EXAM:  BP (!) 120/58 (BP Location: Right Arm, Patient Position: Sitting, Cuff Size: Normal)   Pulse 64   Temp 98.1 F (36.7 C) (Oral)   Ht 5' 7.5" (1.715 m)   Wt 122 lb 9.6 oz (55.6 kg)   SpO2 98%   BMI 18.92 kg/m   Body mass index is 18.92 kg/m. Wt Readings from Last 3 Encounters:  06/19/18 122 lb 9.6 oz (55.6 kg)  02/13/18 121 lb (54.9 kg)  04/20/17 123 lb (55.8 kg)    Physical Exam: Vital signs reviewed CXK:GYJE is a well-developed well-nourished alert cooperative    who appearsr stated age in no acute distress.  Slender habitus  HEENT: normocephalic atraumatic , Eyes: PERRL EOM's full, conjunctiva clear, Nares: paten,t no deformity discharge or tenderness., Ears: no deformity EAC's clear TMs with normal landmarks. Mouth: clear OP, no lesions, edema.  Moist mucous membranes. Dentition in adequate repair. NECK: supple without masses, thyromegaly or bruits. CHEST/PULM:  Clear to auscultation and percussion breath sounds equal no wheeze , rales or rhonchi. No chest wall deformities or tenderness. Breast: normal by inspection . No dimpling, discharge, masses, tenderness or discharge . CV: PMI is nondisplaced, S1 S2 no gallops, murmurs, rubs. Peripheral pulses are full without delay.No JVD .  ABDOMEN: Bowel sounds normal nontender  No guard or rebound, no hepato splenomegal no CVA tenderness.  No hernia. Extremtities:  No clubbing cyanosis or edema, no acute joint swelling or redness no focal atrophy NEURO:  Oriented x3, cranial nerves 3-12 appear to be intact, no obvious focal weakness,gait within normal limits no abnormal reflexes or asymmetrical SKIN: No acute rashes normal turgor, color, no bruising or petechiae. PSYCH: Oriented, good eye contact, no obvious depression anxiety, cognition and  judgment appear normal. LN: no cervical axillary inguinal adenopathy  Lab Results  Component Value Date   WBC 4.3 06/19/2018   HGB 13.3 06/19/2018   HCT 39.3 06/19/2018   PLT 193.0 06/19/2018   GLUCOSE 70 06/19/2018   CHOL  164 06/19/2018   TRIG 91.0 06/19/2018   HDL 83.30 06/19/2018   LDLCALC 62 06/19/2018   ALT 27 06/19/2018   AST 22 06/19/2018   NA 139 06/19/2018   K 4.4 06/19/2018   CL 101 06/19/2018   CREATININE 0.91 06/19/2018   BUN 13 06/19/2018   CO2 33 (H) 06/19/2018   TSH 1.65 06/19/2018    BP Readings from Last 3 Encounters:  06/19/18 (!) 120/58  02/13/18 (!) 86/60  04/20/17 110/80    nf lab today  ASSESSMENT AND PLAN:  Discussed the following assessment and plan:  Visit for preventive health examination - Plan: Basic metabolic panel, CBC with Differential/Platelet, Hepatic function panel, Lipid panel, TSH  Medication management - Plan: Basic metabolic panel, CBC with Differential/Platelet, Hepatic function panel, Lipid panel, TSH  Ear congestion, unspecified laterality - Plan: Basic metabolic panel, CBC with Differential/Platelet, Hepatic function panel, Lipid panel, TSH  History of syncope - Plan: Basic metabolic panel, CBC with Differential/Platelet, Hepatic function panel, Lipid panel, TSH  Dysfunction of Eustachian tube, unspecified laterality - Plan: Basic metabolic panel, CBC with Differential/Platelet, Hepatic function panel, Lipid panel, TSH  Screening mammography declined Syncope seems to be related to medication and extenuating circumstances however I agree with having her follow-up with cardiology exam is reassuring today.  Lab screening. See below.  Immunizations reviewed consideration of hepatitis A especially if she travels otherwise she is probably up-to-date. Declines mammogram reported negative Cologuard. Patient Care Team: Panosh, Standley Brooking, MD as PCP - General [place], Amy, MD as Consulting Physician (Dermatology) Lelon Perla, MD  (Cardiology) Patient Instructions  I agree that  Syncope probably from medication side effect .  Try daily nasal cortisone  2 sprays each nostril every day for at least  3 weeks and if helps continue .   Cardiology follow up as you plan.   Get appt with me for  Pap gyne check  After that when convenient  To be UTD.   Will notify you  of labs when available.   Health Maintenance, Female Adopting a healthy lifestyle and getting preventive care can go a long way to promote health and wellness. Talk with your health care provider about what schedule of regular examinations is right for you. This is a good chance for you to check in with your provider about disease prevention and staying healthy. In between checkups, there are plenty of things you can do on your own. Experts have done a lot of research about which lifestyle changes and preventive measures are most likely to keep you healthy. Ask your health care provider for more information. Weight and diet Eat a healthy diet  Be sure to include plenty of vegetables, fruits, low-fat dairy products, and lean protein.  Do not eat a lot of foods high in solid fats, added sugars, or salt.  Get regular exercise. This is one of the most important things you can do for your health. ? Most adults should exercise for at least 150 minutes each week. The exercise should increase your heart rate and make you sweat (moderate-intensity exercise). ? Most adults should also do strengthening exercises at least twice a week. This is in addition to the moderate-intensity exercise.  Maintain a healthy weight  Body mass index (BMI) is a measurement that can be used to identify possible weight problems. It estimates body fat based on height and weight. Your health care provider can help determine your BMI and help you achieve or maintain a healthy weight.  For females 9 years of age and older: ? A BMI below 18.5 is considered underweight. ? A BMI of 18.5 to  24.9 is normal. ? A BMI of 25 to 29.9 is considered overweight. ? A BMI of 30 and above is considered obese.  Watch levels of cholesterol and blood lipids  You should start having your blood tested for lipids and cholesterol at 55 years of age, then have this test every 5 years.  You may need to have your cholesterol levels checked more often if: ? Your lipid or cholesterol levels are high. ? You are older than 55 years of age. ? You are at high risk for heart disease.  Cancer screening Lung Cancer  Lung cancer screening is recommended for adults 43-76 years old who are at high risk for lung cancer because of a history of smoking.  A yearly low-dose CT scan of the lungs is recommended for people who: ? Currently smoke. ? Have quit within the past 15 years. ? Have at least a 30-pack-year history of smoking. A pack year is smoking an average of one pack of cigarettes a day for 1 year.  Yearly screening should continue until it has been 15 years since you quit.  Yearly screening should stop if you develop a health problem that would prevent you from having lung cancer treatment.  Breast Cancer  Practice breast self-awareness. This means understanding how your breasts normally appear and feel.  It also means doing regular breast self-exams. Let your health care provider know about any changes, no matter how small.  If you are in your 20s or 30s, you should have a clinical breast exam (CBE) by a health care provider every 1-3 years as part of a regular health exam.  If you are 78 or older, have a CBE every year. Also consider having a breast X-ray (mammogram) every year.  If you have a family history of breast cancer, talk to your health care provider about genetic screening.  If you are at high risk for breast cancer, talk to your health care provider about having an MRI and a mammogram every year.  Breast cancer gene (BRCA) assessment is recommended for women who have family  members with BRCA-related cancers. BRCA-related cancers include: ? Breast. ? Ovarian. ? Tubal. ? Peritoneal cancers.  Results of the assessment will determine the need for genetic counseling and BRCA1 and BRCA2 testing.  Cervical Cancer Your health care provider may recommend that you be screened regularly for cancer of the pelvic organs (ovaries, uterus, and vagina). This screening involves a pelvic examination, including checking for microscopic changes to the surface of your cervix (Pap test). You may be encouraged to have this screening done every 3 years, beginning at age 20.  For women ages 63-65, health care providers may recommend pelvic exams and Pap testing every 3 years, or they may recommend the Pap and pelvic exam, combined with testing for human papilloma virus (HPV), every 5 years. Some types of HPV increase your risk of cervical cancer. Testing for HPV may also be done on women of any age with unclear Pap test results.  Other health care providers may not recommend any screening for nonpregnant women who are considered low risk for pelvic cancer and who do not have symptoms. Ask your health care provider if a screening pelvic exam is right for you.  If you have had past treatment for cervical cancer or a condition that could lead to cancer, you need Pap  tests and screening for cancer for at least 20 years after your treatment. If Pap tests have been discontinued, your risk factors (such as having a new sexual partner) need to be reassessed to determine if screening should resume. Some women have medical problems that increase the chance of getting cervical cancer. In these cases, your health care provider may recommend more frequent screening and Pap tests.  Colorectal Cancer  This type of cancer can be detected and often prevented.  Routine colorectal cancer screening usually begins at 55 years of age and continues through 55 years of age.  Your health care provider may  recommend screening at an earlier age if you have risk factors for colon cancer.  Your health care provider may also recommend using home test kits to check for hidden blood in the stool.  A small camera at the end of a tube can be used to examine your colon directly (sigmoidoscopy or colonoscopy). This is done to check for the earliest forms of colorectal cancer.  Routine screening usually begins at age 70.  Direct examination of the colon should be repeated every 5-10 years through 55 years of age. However, you may need to be screened more often if early forms of precancerous polyps or small growths are found.  Skin Cancer  Check your skin from head to toe regularly.  Tell your health care provider about any new moles or changes in moles, especially if there is a change in a mole's shape or color.  Also tell your health care provider if you have a mole that is larger than the size of a pencil eraser.  Always use sunscreen. Apply sunscreen liberally and repeatedly throughout the day.  Protect yourself by wearing long sleeves, pants, a wide-brimmed hat, and sunglasses whenever you are outside.  Heart disease, diabetes, and high blood pressure  High blood pressure causes heart disease and increases the risk of stroke. High blood pressure is more likely to develop in: ? People who have blood pressure in the high end of the normal range (130-139/85-89 mm Hg). ? People who are overweight or obese. ? People who are African American.  If you are 45-3 years of age, have your blood pressure checked every 3-5 years. If you are 54 years of age or older, have your blood pressure checked every year. You should have your blood pressure measured twice-once when you are at a hospital or clinic, and once when you are not at a hospital or clinic. Record the average of the two measurements. To check your blood pressure when you are not at a hospital or clinic, you can use: ? An automated blood pressure  machine at a pharmacy. ? A home blood pressure monitor.  If you are between 19 years and 6 years old, ask your health care provider if you should take aspirin to prevent strokes.  Have regular diabetes screenings. This involves taking a blood sample to check your fasting blood sugar level. ? If you are at a normal weight and have a low risk for diabetes, have this test once every three years after 55 years of age. ? If you are overweight and have a high risk for diabetes, consider being tested at a younger age or more often. Preventing infection Hepatitis B  If you have a higher risk for hepatitis B, you should be screened for this virus. You are considered at high risk for hepatitis B if: ? You were born in a country where hepatitis B is  common. Ask your health care provider which countries are considered high risk. ? Your parents were born in a high-risk country, and you have not been immunized against hepatitis B (hepatitis B vaccine). ? You have HIV or AIDS. ? You use needles to inject street drugs. ? You live with someone who has hepatitis B. ? You have had sex with someone who has hepatitis B. ? You get hemodialysis treatment. ? You take certain medicines for conditions, including cancer, organ transplantation, and autoimmune conditions.  Hepatitis C  Blood testing is recommended for: ? Everyone born from [birth year] through 1963. ? Anyone with known risk factors for hepatitis C.  Sexually transmitted infections (STIs)  You should be screened for sexually transmitted infections (STIs) including gonorrhea and chlamydia if: ? You are sexually active and are younger than 55 years of age. ? You are older than 55 years of age and your health care provider tells you that you are at risk for this type of infection. ? Your sexual activity has changed since you were last screened and you are at an increased risk for chlamydia or gonorrhea. Ask your health care provider if you are at  risk.  If you do not have HIV, but are at risk, it may be recommended that you take a prescription medicine daily to prevent HIV infection. This is called pre-exposure prophylaxis (PrEP). You are considered at risk if: ? You are sexually active and do not regularly use condoms or know the HIV status of your partner(s). ? You take drugs by injection. ? You are sexually active with a partner who has HIV.  Talk with your health care provider about whether you are at high risk of being infected with HIV. If you choose to begin PrEP, you should first be tested for HIV. You should then be tested every 3 months for as long as you are taking PrEP. Pregnancy  If you are premenopausal and you may become pregnant, ask your health care provider about preconception counseling.  If you may become pregnant, take 400 to 800 micrograms (mcg) of folic acid every day.  If you want to prevent pregnancy, talk to your health care provider about birth control (contraception). Osteoporosis and menopause  Osteoporosis is a disease in which the bones lose minerals and strength with aging. This can result in serious bone fractures. Your risk for osteoporosis can be identified using a bone density scan.  If you are 10 years of age or older, or if you are at risk for osteoporosis and fractures, ask your health care provider if you should be screened.  Ask your health care provider whether you should take a calcium or vitamin D supplement to lower your risk for osteoporosis.  Menopause may have certain physical symptoms and risks.  Hormone replacement therapy may reduce some of these symptoms and risks. Talk to your health care provider about whether hormone replacement therapy is right for you. Follow these instructions at home:  Schedule regular health, dental, and eye exams.  Stay current with your immunizations.  Do not use any tobacco products including cigarettes, chewing tobacco, or electronic  cigarettes.  If you are pregnant, do not drink alcohol.  If you are breastfeeding, limit how much and how often you drink alcohol.  Limit alcohol intake to no more than 1 drink per day for nonpregnant women. One drink equals 12 ounces of beer, 5 ounces of wine, or 1 ounces of hard liquor.  Do not use street drugs.  Do  not share needles.  Ask your health care provider for help if you need support or information about quitting drugs.  Tell your health care provider if you often feel depressed.  Tell your health care provider if you have ever been abused or do not feel safe at home. This information is not intended to replace advice given to you by your health care provider. Make sure you discuss any questions you have with your health care provider. Document Released: 02/28/2011 Document Revised: 01/21/2016 Document Reviewed: 05/19/2015 Elsevier Interactive Patient Education  2018 [place]. Panosh M.D.

## 2018-06-19 ENCOUNTER — Ambulatory Visit (INDEPENDENT_AMBULATORY_CARE_PROVIDER_SITE_OTHER): Payer: BLUE CROSS/BLUE SHIELD | Admitting: Internal Medicine

## 2018-06-19 ENCOUNTER — Encounter: Payer: Self-pay | Admitting: Internal Medicine

## 2018-06-19 VITALS — BP 120/58 | HR 64 | Temp 98.1°F | Ht 67.5 in | Wt 122.6 lb

## 2018-06-19 DIAGNOSIS — Z87898 Personal history of other specified conditions: Secondary | ICD-10-CM | POA: Diagnosis not present

## 2018-06-19 DIAGNOSIS — Z79899 Other long term (current) drug therapy: Secondary | ICD-10-CM

## 2018-06-19 DIAGNOSIS — H938X9 Other specified disorders of ear, unspecified ear: Secondary | ICD-10-CM | POA: Diagnosis not present

## 2018-06-19 DIAGNOSIS — Z0000 Encounter for general adult medical examination without abnormal findings: Secondary | ICD-10-CM | POA: Diagnosis not present

## 2018-06-19 DIAGNOSIS — H6980 Other specified disorders of Eustachian tube, unspecified ear: Secondary | ICD-10-CM | POA: Diagnosis not present

## 2018-06-19 DIAGNOSIS — Z5320 Procedure and treatment not carried out because of patient's decision for unspecified reasons: Secondary | ICD-10-CM | POA: Diagnosis not present

## 2018-06-19 LAB — LIPID PANEL
CHOL/HDL RATIO: 2
Cholesterol: 164 mg/dL (ref 0–200)
HDL: 83.3 mg/dL (ref >39.00)
LDL Cholesterol: 62 mg/dL (ref 0–99)
NONHDL: 80.64
Triglycerides: 91 mg/dL (ref 0.0–149.0)
VLDL: 18.2 mg/dL (ref 0.0–40.0)

## 2018-06-19 LAB — CBC WITH DIFFERENTIAL/PLATELET
BASOS PCT: 0.9 % (ref 0.0–3.0)
Basophils Absolute: 0 10*3/uL (ref 0.0–0.1)
EOS ABS: 0.1 10*3/uL (ref 0.0–0.7)
EOS PCT: 3.4 % (ref 0.0–5.0)
HCT: 39.3 % (ref 36.0–46.0)
HEMOGLOBIN: 13.3 g/dL (ref 12.0–15.0)
Lymphocytes Relative: 23.1 % (ref 12.0–46.0)
Lymphs Abs: 1 10*3/uL (ref 0.7–4.0)
MCHC: 33.8 g/dL (ref 30.0–36.0)
MCV: 91.7 fl (ref 78.0–100.0)
MONO ABS: 0.4 10*3/uL (ref 0.1–1.0)
Monocytes Relative: 9.5 % (ref 3.0–12.0)
Neutro Abs: 2.7 10*3/uL (ref 1.4–7.7)
Neutrophils Relative %: 63.1 % (ref 43.0–77.0)
Platelets: 193 10*3/uL (ref 150.0–400.0)
RBC: 4.28 Mil/uL (ref 3.87–5.11)
RDW: 14.3 % (ref 11.5–15.5)
WBC: 4.3 10*3/uL (ref 4.0–10.5)

## 2018-06-19 LAB — HEPATIC FUNCTION PANEL
ALK PHOS: 45 U/L (ref 39–117)
ALT: 27 U/L (ref 0–35)
AST: 22 U/L (ref 0–37)
Albumin: 4.9 g/dL (ref 3.5–5.2)
BILIRUBIN DIRECT: 0.2 mg/dL (ref 0.0–0.3)
TOTAL PROTEIN: 7.1 g/dL (ref 6.0–8.3)
Total Bilirubin: 0.9 mg/dL (ref 0.2–1.2)

## 2018-06-19 LAB — BASIC METABOLIC PANEL
BUN: 13 mg/dL (ref 6–23)
CHLORIDE: 101 meq/L (ref 96–112)
CO2: 33 mEq/L — ABNORMAL HIGH (ref 19–32)
CREATININE: 0.91 mg/dL (ref 0.40–1.20)
Calcium: 10 mg/dL (ref 8.4–10.5)
GFR: 68.09 mL/min (ref >60.00)
Glucose, Bld: 70 mg/dL (ref 70–99)
POTASSIUM: 4.4 meq/L (ref 3.5–5.1)
Sodium: 139 mEq/L (ref 135–145)

## 2018-06-19 LAB — TSH: TSH: 1.65 u[IU]/mL (ref 0.35–4.50)

## 2018-06-19 NOTE — Patient Instructions (Signed)
I agree that  Syncope probably from medication side effect .  Try daily nasal cortisone  2 sprays each nostril every day for at least  3 weeks and if helps continue .   Cardiology follow up as you plan.   Get appt with me for  Pap gyne check  After that when convenient  To be UTD.   Will notify you  of labs when available.   Health Maintenance, Female Adopting a healthy lifestyle and getting preventive care can go a long way to promote health and wellness. Talk with your health care provider about what schedule of regular examinations is right for you. This is a good chance for you to check in with your provider about disease prevention and staying healthy. In between checkups, there are plenty of things you can do on your own. Experts have done a lot of research about which lifestyle changes and preventive measures are most likely to keep you healthy. Ask your health care provider for more information. Weight and diet Eat a healthy diet  Be sure to include plenty of vegetables, fruits, low-fat dairy products, and lean protein.  Do not eat a lot of foods high in solid fats, added sugars, or salt.  Get regular exercise. This is one of the most important things you can do for your health. ? Most adults should exercise for at least 150 minutes each week. The exercise should increase your heart rate and make you sweat (moderate-intensity exercise). ? Most adults should also do strengthening exercises at least twice a week. This is in addition to the moderate-intensity exercise.  Maintain a healthy weight  Body mass index (BMI) is a measurement that can be used to identify possible weight problems. It estimates body fat based on height and weight. Your health care provider can help determine your BMI and help you achieve or maintain a healthy weight.  For females 49 years of age and older: ? A BMI below 18.5 is considered underweight. ? A BMI of 18.5 to 24.9 is normal. ? A BMI of 25 to 29.9  is considered overweight. ? A BMI of 30 and above is considered obese.  Watch levels of cholesterol and blood lipids  You should start having your blood tested for lipids and cholesterol at 55 years of age, then have this test every 5 years.  You may need to have your cholesterol levels checked more often if: ? Your lipid or cholesterol levels are high. ? You are older than 55 years of age. ? You are at high risk for heart disease.  Cancer screening Lung Cancer  Lung cancer screening is recommended for adults 20-22 years old who are at high risk for lung cancer because of a history of smoking.  A yearly low-dose CT scan of the lungs is recommended for people who: ? Currently smoke. ? Have quit within the past 15 years. ? Have at least a 30-pack-year history of smoking. A pack year is smoking an average of one pack of cigarettes a day for 1 year.  Yearly screening should continue until it has been 15 years since you quit.  Yearly screening should stop if you develop a health problem that would prevent you from having lung cancer treatment.  Breast Cancer  Practice breast self-awareness. This means understanding how your breasts normally appear and feel.  It also means doing regular breast self-exams. Let your health care provider know about any changes, no matter how small.  If you are in  your 20s or 30s, you should have a clinical breast exam (CBE) by a health care provider every 1-3 years as part of a regular health exam.  If you are 18 or older, have a CBE every year. Also consider having a breast X-ray (mammogram) every year.  If you have a family history of breast cancer, talk to your health care provider about genetic screening.  If you are at high risk for breast cancer, talk to your health care provider about having an MRI and a mammogram every year.  Breast cancer gene (BRCA) assessment is recommended for women who have family members with BRCA-related cancers.  BRCA-related cancers include: ? Breast. ? Ovarian. ? Tubal. ? Peritoneal cancers.  Results of the assessment will determine the need for genetic counseling and BRCA1 and BRCA2 testing.  Cervical Cancer Your health care provider may recommend that you be screened regularly for cancer of the pelvic organs (ovaries, uterus, and vagina). This screening involves a pelvic examination, including checking for microscopic changes to the surface of your cervix (Pap test). You may be encouraged to have this screening done every 3 years, beginning at age 64.  For women ages 27-65, health care providers may recommend pelvic exams and Pap testing every 3 years, or they may recommend the Pap and pelvic exam, combined with testing for human papilloma virus (HPV), every 5 years. Some types of HPV increase your risk of cervical cancer. Testing for HPV may also be done on women of any age with unclear Pap test results.  Other health care providers may not recommend any screening for nonpregnant women who are considered low risk for pelvic cancer and who do not have symptoms. Ask your health care provider if a screening pelvic exam is right for you.  If you have had past treatment for cervical cancer or a condition that could lead to cancer, you need Pap tests and screening for cancer for at least 20 years after your treatment. If Pap tests have been discontinued, your risk factors (such as having a new sexual partner) need to be reassessed to determine if screening should resume. Some women have medical problems that increase the chance of getting cervical cancer. In these cases, your health care provider may recommend more frequent screening and Pap tests.  Colorectal Cancer  This type of cancer can be detected and often prevented.  Routine colorectal cancer screening usually begins at 55 years of age and continues through 55 years of age.  Your health care provider may recommend screening at an earlier age if  you have risk factors for colon cancer.  Your health care provider may also recommend using home test kits to check for hidden blood in the stool.  A small camera at the end of a tube can be used to examine your colon directly (sigmoidoscopy or colonoscopy). This is done to check for the earliest forms of colorectal cancer.  Routine screening usually begins at age 70.  Direct examination of the colon should be repeated every 5-10 years through 55 years of age. However, you may need to be screened more often if early forms of precancerous polyps or small growths are found.  Skin Cancer  Check your skin from head to toe regularly.  Tell your health care provider about any new moles or changes in moles, especially if there is a change in a mole's shape or color.  Also tell your health care provider if you have a mole that is larger than the size  of a pencil eraser.  Always use sunscreen. Apply sunscreen liberally and repeatedly throughout the day.  Protect yourself by wearing long sleeves, pants, a wide-brimmed hat, and sunglasses whenever you are outside.  Heart disease, diabetes, and high blood pressure  High blood pressure causes heart disease and increases the risk of stroke. High blood pressure is more likely to develop in: ? People who have blood pressure in the high end of the normal range (130-139/85-89 mm Hg). ? People who are overweight or obese. ? People who are African American.  If you are 33-78 years of age, have your blood pressure checked every 3-5 years. If you are 50 years of age or older, have your blood pressure checked every year. You should have your blood pressure measured twice-once when you are at a hospital or clinic, and once when you are not at a hospital or clinic. Record the average of the two measurements. To check your blood pressure when you are not at a hospital or clinic, you can use: ? An automated blood pressure machine at a pharmacy. ? A home blood  pressure monitor.  If you are between 55 years and 65 years old, ask your health care provider if you should take aspirin to prevent strokes.  Have regular diabetes screenings. This involves taking a blood sample to check your fasting blood sugar level. ? If you are at a normal weight and have a low risk for diabetes, have this test once every three years after 55 years of age. ? If you are overweight and have a high risk for diabetes, consider being tested at a younger age or more often. Preventing infection Hepatitis B  If you have a higher risk for hepatitis B, you should be screened for this virus. You are considered at high risk for hepatitis B if: ? You were born in a country where hepatitis B is common. Ask your health care provider which countries are considered high risk. ? Your parents were born in a high-risk country, and you have not been immunized against hepatitis B (hepatitis B vaccine). ? You have HIV or AIDS. ? You use needles to inject street drugs. ? You live with someone who has hepatitis B. ? You have had sex with someone who has hepatitis B. ? You get hemodialysis treatment. ? You take certain medicines for conditions, including cancer, organ transplantation, and autoimmune conditions.  Hepatitis C  Blood testing is recommended for: ? Everyone born from [birth year] through 1963. ? Anyone with known risk factors for hepatitis C.  Sexually transmitted infections (STIs)  You should be screened for sexually transmitted infections (STIs) including gonorrhea and chlamydia if: ? You are sexually active and are younger than 55 years of age. ? You are older than 55 years of age and your health care provider tells you that you are at risk for this type of infection. ? Your sexual activity has changed since you were last screened and you are at an increased risk for chlamydia or gonorrhea. Ask your health care provider if you are at risk.  If you do not have HIV, but are at risk,  it may be recommended that you take a prescription medicine daily to prevent HIV infection. This is called pre-exposure prophylaxis (PrEP). You are considered at risk if: ? You are sexually active and do not regularly use condoms or know the HIV status of your partner(s). ? You take drugs by injection. ? You are sexually active with a partner who  has HIV.  Talk with your health care provider about whether you are at high risk of being infected with HIV. If you choose to begin PrEP, you should first be tested for HIV. You should then be tested every 3 months for as long as you are taking PrEP. Pregnancy  If you are premenopausal and you may become pregnant, ask your health care provider about preconception counseling.  If you may become pregnant, take 400 to 800 micrograms (mcg) of folic acid every day.  If you want to prevent pregnancy, talk to your health care provider about birth control (contraception). Osteoporosis and menopause  Osteoporosis is a disease in which the bones lose minerals and strength with aging. This can result in serious bone fractures. Your risk for osteoporosis can be identified using a bone density scan.  If you are 37 years of age or older, or if you are at risk for osteoporosis and fractures, ask your health care provider if you should be screened.  Ask your health care provider whether you should take a calcium or vitamin D supplement to lower your risk for osteoporosis.  Menopause may have certain physical symptoms and risks.  Hormone replacement therapy may reduce some of these symptoms and risks. Talk to your health care provider about whether hormone replacement therapy is right for you. Follow these instructions at home:  Schedule regular health, dental, and eye exams.  Stay current with your immunizations.  Do not use any tobacco products including cigarettes, chewing tobacco, or electronic cigarettes.  If you are pregnant, do not drink  alcohol.  If you are breastfeeding, limit how much and how often you drink alcohol.  Limit alcohol intake to no more than 1 drink per day for nonpregnant women. One drink equals 12 ounces of beer, 5 ounces of wine, or 1 ounces of hard liquor.  Do not use street drugs.  Do not share needles.  Ask your health care provider for help if you need support or information about quitting drugs.  Tell your health care provider if you often feel depressed.  Tell your health care provider if you have ever been abused or do not feel safe at home. This information is not intended to replace advice given to you by your health care provider. Make sure you discuss any questions you have with your health care provider. Document Released: 02/28/2011 Document Revised: 01/21/2016 Document Reviewed: 05/19/2015 Elsevier Interactive Patient Education  [employer].

## 2018-06-28 NOTE — Progress Notes (Deleted)
Karlyne Greenspan MD Reason for referral-mitral valve prolapse and syncope  HPI: 57 year old female for evaluation of mitral valve prolapse and syncope at request of Berniece Andreas MD.  I have seen previously but not since November 2015. Echocardiogram 04/2004 showed normal LV function, small pericardial effusion and an echobright region on the tricuspid valve suggesting nodule. TEE 05/2004 revealed normal LV function, mild MV prolapse involving the anterior leaflet with mild mitral regurgitation, an elongated redundant tricuspid valve with prolapse and mild tricuspid regurgitation as well as a small pericardial effusion. Stress echocardiogram 05/2004 was normal.  Last echocardiogram December 2015 showed normal LV function and trace mitral regurgitation.  Current Outpatient Medications  Medication Sig Dispense Refill  . CALCIUM PO Take by mouth.    . Cholecalciferol (VITAMIN D PO) Take 400 mg by mouth.    . CYANOCOBALAMIN PO Take by mouth.     No current facility-administered medications for this visit.     No Known Allergies  Past Medical History:  Diagnosis Date  . Anemia   . Family history of other specified malignant neoplasm   . IBS (irritable bowel syndrome)   . MVP (mitral valve prolapse)   . Other and unspecified ovarian cyst   . Raynaud's disease   . UTI (lower urinary tract infection)   . Varicella     Past Surgical History:  Procedure Laterality Date  . TONSILLECTOMY      Social History   Socioeconomic History  . Marital status: Married    Spouse name: Not on file  . Number of children: 2  . Years of education: Not on file  . Highest education level: Not on file  Occupational History  . Not on file  Social Needs  . Financial resource strain: Not on file  . Food insecurity:    Worry: Not on file    Inability: Not on file  . Transportation needs:    Medical: Not on file    Non-medical: Not on file  Tobacco Use  . Smoking status: Never Smoker  .  Smokeless tobacco: Never Used  Substance and Sexual Activity  . Alcohol use: Yes    Comment: 2 glasses of wine per week  . Drug use: No  . Sexual activity: Not on file  Lifestyle  . Physical activity:    Days per week: Not on file    Minutes per session: Not on file  . Stress: Not on file  Relationships  . Social connections:    Talks on phone: Not on file    Gets together: Not on file    Attends religious service: Not on file    Active member of club or organization: Not on file    Attends meetings of clubs or organizations: Not on file    Relationship status: Not on file  . Intimate partner violence:    Fear of current or ex partner: Not on file    Emotionally abused: Not on file    Physically abused: Not on file    Forced sexual activity: Not on file  Other Topics Concern  . 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 Hyperlipidemia Unknown   . Hypertension Father   . Melanoma Unknown   .  Skin cancer Unknown     ROS: no fevers or chills, productive cough, hemoptysis, dysphasia, odynophagia, melena, hematochezia, dysuria, hematuria, rash, seizure activity, orthopnea, PND, pedal edema, claudication. Remaining systems are negative.  Physical Exam:   There were no vitals taken for this visit.  General:  Well developed/well nourished in NAD Skin warm/dry Patient not depressed No peripheral clubbing Back-normal HEENT-normal/normal eyelids Neck supple/normal carotid upstroke bilaterally; no bruits; no JVD; no thyromegaly chest - CTA/ normal expansion CV - RRR/normal S1 and S2; no murmurs, rubs or gallops;  PMI nondisplaced Abdomen -NT/ND, no HSM, no mass, + bowel sounds, no bruit 2+ femoral pulses, no bruits Ext-no edema, chords, 2+ DP Neuro-grossly nonfocal  ECG - personally  reviewed  A/P  1  Olga Millers, MD

## 2018-07-10 ENCOUNTER — Ambulatory Visit (INDEPENDENT_AMBULATORY_CARE_PROVIDER_SITE_OTHER): Payer: BLUE CROSS/BLUE SHIELD | Admitting: Cardiology

## 2018-07-10 ENCOUNTER — Ambulatory Visit: Payer: BLUE CROSS/BLUE SHIELD | Admitting: Cardiology

## 2018-07-10 ENCOUNTER — Encounter: Payer: Self-pay | Admitting: Cardiology

## 2018-07-10 VITALS — BP 88/64 | HR 69 | Ht 67.5 in | Wt 123.0 lb

## 2018-07-10 DIAGNOSIS — R55 Syncope and collapse: Secondary | ICD-10-CM | POA: Diagnosis not present

## 2018-07-10 DIAGNOSIS — I341 Nonrheumatic mitral (valve) prolapse: Secondary | ICD-10-CM

## 2018-07-10 NOTE — Progress Notes (Signed)
Destiny Greenspan MD Reason for referral-mitral valve prolapse and syncope  HPI: 57 year old female for evaluation of mitral valve prolapse and syncope at request of Berniece Andreas MD.  Patient seen previously but not since November 2015. Echocardiogram 9/05 showed normal LV function, small pericardial effusion and an echobright region on the tricuspid valve suggesting nodule. TEE 10/05 revealed normal LV function, mild MV prolapse involving the anterior leaflet with mild mitral regurgitation, an elongated redundant tricuspid valve with prolapse and mild tricuspid regurgitation as well as a small pericardial effusion. Stress echo 10/05 normal.  Echocardiogram repeated December 2015 and showed normal LV function, no frank mitral valve prolapse and trace mitral regurgitation.  Approximately 1-1/2 months ago patient had a syncopal episode.  She was taking medications and felt nauseated prior to her episode.  No preceding chest pain, palpitations or dyspnea.  Unconscious for approximately 1 minute.  She otherwise denies dyspnea on exertion, orthopnea, PND, pedal edema, palpitations or exertional chest pain.  Cardiology asked to evaluate.  Current Outpatient Medications  Medication Sig Dispense Refill  . CALCIUM PO Take 500 Units by mouth daily.     . Cholecalciferol (VITAMIN D PO) Take 500 mcg by mouth.     . CYANOCOBALAMIN PO Take 500 Units by mouth daily.      No current facility-administered medications for this visit.     Allergies  Allergen Reactions  . Dextromethorphan Nausea Only and Other (See Comments)    Got dizzy and passed out.     Past Medical History:  Diagnosis Date  . Anemia   . Family history of other specified malignant neoplasm   . IBS (irritable bowel syndrome)   . MVP (mitral valve prolapse)   . Other and unspecified ovarian cyst   . Raynaud's disease   . UTI (lower urinary tract infection)   . Varicella     Past Surgical History:  Procedure Laterality Date   . TONSILLECTOMY      Social History   Socioeconomic History  . Marital status: Married    Spouse name: Not on file  . Number of children: 2  . Years of education: Not on file  . Highest education level: Not on file  Occupational History  . Not on file  Social Needs  . Financial resource strain: Not on file  . Food insecurity:    Worry: Not on file    Inability: Not on file  . Transportation needs:    Medical: Not on file    Non-medical: Not on file  Tobacco Use  . Smoking status: Never Smoker  . Smokeless tobacco: Never Used  Substance and Sexual Activity  . Alcohol use: Yes    Comment: 2 glasses of wine per week  . Drug use: No  . Sexual activity: Not on file  Lifestyle  . Physical activity:    Days per week: Not on file    Minutes per session: Not on file  . Stress: Not on file  Relationships  . Social connections:    Talks on phone: Not on file    Gets together: Not on file    Attends religious service: Not on file    Active member of club or organization: Not on file    Attends meetings of clubs or organizations: Not on file    Relationship status: Not on file  . Intimate partner violence:    Fear of current or ex partner: Not on file    Emotionally abused: Not on file  Physically abused: Not on file    Forced sexual activity: Not on file  Other Topics Concern  . 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 Hyperlipidemia Unknown   . Hypertension Father   . Melanoma Unknown   . Skin cancer Unknown   . Heart disease Mother   . Hypertension Mother   . Atrial fibrillation Mother     ROS: no fevers or chills, productive cough, hemoptysis, dysphasia, odynophagia, melena, hematochezia, dysuria, hematuria, rash, seizure activity, orthopnea, PND, pedal edema, claudication.  Remaining systems are negative.  Physical Exam:   Blood pressure (!) 88/64, pulse 69, height 5' 7.5" (1.715 m), weight 123 lb (55.8 kg).  General:  Well developed/well nourished in NAD Skin warm/dry Patient not depressed No peripheral clubbing Back-normal HEENT-normal/normal eyelids Neck supple/normal carotid upstroke bilaterally; no bruits; no JVD; no thyromegaly chest - CTA/ normal expansion CV - RRR/normal S1 and S2; no murmurs, rubs or gallops;  PMI nondisplaced Abdomen -NT/ND, no HSM, no mass, + bowel sounds, no bruit 2+ femoral pulses, no bruits Ext-no edema, chords, 2+ DP Neuro-grossly nonfocal  ECG -sinus rhythm at a rate of 69.  Short PR interval, nonspecific ST changes.  Right axis deviation.  Personally reviewed  A/P  1 syncope-likely vagal episode.  We will check echocardiogram for LV function.  2 mitral valve prolapse-history of mild mitral and tricuspid regurgitation.  We will plan repeat echo to further assess.  3 chest pain-no recent symptoms.  Olga MillersBrian Kymari Lollis, MD

## 2018-07-10 NOTE — Patient Instructions (Signed)
Medication Instructions:  Your physician recommends that you continue on your current medications as directed. Please refer to the Current Medication list given to you today.  If you need a refill on your cardiac medications before your next appointment, please call your pharmacy.   Lab work: None ordered If you have labs (blood work) drawn today and your tests are completely normal, you will receive your results only by: . MyChart Message (if you have MyChart) OR . A paper copy in the mail If you have any lab test that is abnormal or we need to change your treatment, we will call you to review the results.  Testing/Procedures: Your physician has requested that you have an echocardiogram. Echocardiography is a painless test that uses sound waves to create images of your heart. It provides your doctor with information about the size and shape of your heart and how well your heart's chambers and valves are working. This procedure takes approximately one hour. There are no restrictions for this procedure.    Follow-Up: At [facility], you and your health needs are our priority.  As part of our continuing mission to provide you with exceptional heart care, we have created designated Provider Care Teams.  These Care Teams include your primary Cardiologist (physician) and Advanced Practice Providers (APPs -  Physician Assistants and Nurse Practitioners) who all work together to provide you with the care you need, when you need it. Your physician recommends that you schedule a follow-up appointment as needed pending results.   Any Other Special Instructions Will Be Listed Below (If Applicable).    

## 2018-07-17 ENCOUNTER — Other Ambulatory Visit (HOSPITAL_COMMUNITY): Payer: BLUE CROSS/BLUE SHIELD

## 2018-10-30 ENCOUNTER — Ambulatory Visit (HOSPITAL_COMMUNITY): Payer: BLUE CROSS/BLUE SHIELD | Attending: Cardiovascular Disease

## 2018-10-30 DIAGNOSIS — R55 Syncope and collapse: Secondary | ICD-10-CM | POA: Insufficient documentation

## 2018-10-30 DIAGNOSIS — I341 Nonrheumatic mitral (valve) prolapse: Secondary | ICD-10-CM | POA: Insufficient documentation

## 2019-02-27 ENCOUNTER — Telehealth: Payer: BLUE CROSS/BLUE SHIELD | Admitting: Nurse Practitioner

## 2019-02-27 DIAGNOSIS — R059 Cough, unspecified: Secondary | ICD-10-CM

## 2019-02-27 DIAGNOSIS — R05 Cough: Secondary | ICD-10-CM

## 2019-02-27 DIAGNOSIS — Z20822 Contact with and (suspected) exposure to covid-19: Secondary | ICD-10-CM

## 2019-02-27 MED ORDER — BENZONATATE 100 MG PO CAPS: 100.0000 mg | ORAL_CAPSULE | Freq: Three times a day (TID) | ORAL | 0 refills | Status: DC | PRN

## 2019-02-27 NOTE — Progress Notes (Signed)
Virtual Visit via Video Note  I connected with@ on 02/28/19 at  8:30 AM EDT by a video enabled telemedicine application and verified that I am speaking with the correct person using two identifiers. Location patient: home Location provider: home office Persons participating in the virtual visit: patient, provider  W[place]th national recommendations  regarding COVID 19 pandemic   video visit is advised over in office visit for this patient.  Patient aware  of the limitations of evaluation and management by telemedicine and  availability of in person appointments. and agreed to proceed.   HPI:  Destiny Carpenter presents for video visit  Concern about  covid possibility .  Has had a cough off and on but more recent without fever runny nose  Does have ocass sneezing but no sob . nonspecific exposure but  Has been out and about  lowes with people. She will need to transport her 56 yo parents  In a 12 hour car trip next week  To [place] and  Wants to get as much information to be sure that she is not covid contagious.      Cough off and on had in March and went away and now more recently back but no sob wheezing   Still having off and on left ear high pitched sounds more recently  ? If flakiness in ear  Using drops at times  Thinks hearing ok  When went to [facility] ear felt better?    Has tried many things in past but hast done the [employer]  For reg 2 weeks .  ROS: See pertinent positives and negatives per HPI. No fever chills  hjas children no ill at time   Past Medical History:  Diagnosis Date  . Anemia   . Family history of other specified malignant neoplasm   . IBS (irritable bowel syndrome)   . MVP (mitral valve prolapse)   . Other and unspecified ovarian cyst   . Raynaud's disease   . UTI (lower urinary tract infection)   . Varicella     Past Surgical History:  Procedure Laterality Date  . TONSILLECTOMY      Family History  Problem Relation Age of Onset  .  Hyperlipidemia Unknown   . Hypertension Father   . Melanoma Unknown   . Skin cancer Unknown   . Heart disease Mother   . Hypertension Mother   . Atrial fibrillation Mother     Social History   Tobacco Use  . Smoking status: Never Smoker  . Smokeless tobacco: Never Used  Substance Use Topics  . Alcohol use: Yes    Comment: 2 glasses of wine per week  . Drug use: No      Current Outpatient Medications:  .  benzonatate (TESSALON PERLES) 100 MG capsule, Take 1 capsule (100 mg total) by mouth 3 (three) times daily as needed for cough., Disp: 20 capsule, Rfl: 0 .  CALCIUM PO, Take 500 Units by mouth daily. , Disp: , Rfl:  .  Cholecalciferol (VITAMIN D PO), Take 500 mcg by mouth. , Disp: , Rfl:  .  CYANOCOBALAMIN PO, Take 500 Units by mouth daily. , Disp: , Rfl:   EXAM: BP Readings from Last 3 Encounters:  07/10/18 (!) 88/64  06/19/18 (!) 120/58  02/13/18 (!) 86/60    VITALS per patient if applicable:  GENERAL: alert, oriented, appears well and in no acute distress  HEENT: atraumatic, conjunttiva clear, no obvious abnormalities on inspection of external nose and ears  NECK: normal movements of the head and neck  LUNGS: on inspection no signs of respiratory distress, breathing rate appears normal, no obvious gross SOB, gasping or wheezing  CV: no obvious cyanosis  MS: moves all visible extremities without noticeable abnormality  PSYCH/NEURO: pleasant and cooperative, no obvious depression or anxiety, speech and thought processing grossly intact Lab Results  Component Value Date   WBC 4.3 06/19/2018   HGB 13.3 06/19/2018   HCT 39.3 06/19/2018   PLT 193.0 06/19/2018   GLUCOSE 70 06/19/2018   CHOL 164 06/19/2018   TRIG 91.0 06/19/2018   HDL 83.30 06/19/2018   LDLCALC 62 06/19/2018   ALT 27 06/19/2018   AST 22 06/19/2018   NA 139 06/19/2018   K 4.4 06/19/2018   CL 101 06/19/2018   CREATININE 0.91 06/19/2018   BUN 13 06/19/2018   CO2 33 (H) 06/19/2018   TSH  1.65 06/19/2018    ASSESSMENT AND PLAN:  Discussed the following assessment and plan:    ICD-10-CM   1. Dry cough  R05   2. Tinnitus of left ear  H93.12   3. Dysfunction of Eustachian tube, unspecified laterality  H69.80    Send for [id number]  testing  For reasons defined   And will isolate  In next week to be sure not contagious   Could be atypical allergic sx  Ear sx continuing 4 mos left  Disc seeing ENT for assessment   And hearing eval  .   She will send a message my chart  Otherwise try the 2 week INCS  trial Counseled.   Expectant management and discussion of plan and treatment with opportunity to ask questions and all were answered. The patient agreed with the plan and demonstrated an understanding of the instructions.   Advised to call back or seek an in-person evaluation if worsening  or having  further concerns  In the interiu .     Berniece AndreasWanda Panosh, MD

## 2019-02-27 NOTE — Progress Notes (Signed)
E-Visit for Corona Virus Screening   Your current symptoms could be consistent with the coronavirus.  Call your health care provider or local health department to request and arrange formal testing. Many health care providers can now test patients at their office but not all are.  Please quarantine yourself while awaiting your test results.  [facility] [phone], [place] [phone], [facility] [phone] or visit [email or web address]     COVID-19 is a respiratory illness with symptoms that are similar to the flu. Symptoms are typically mild to moderate, but there have been cases of severe illness and death due to the virus. The following symptoms may appear 2-14 days after exposure: . Fever . Cough . Shortness of breath or difficulty breathing . Chills . Repeated shaking with chills . Muscle pain . Headache . Sore throat . New loss of taste or smell . Fatigue . Congestion or runny nose . Nausea or vomiting . Diarrhea  It is vitally important that if you feel that you have an infection such as this virus or any other virus that you stay home and away from places where you may spread it to others.  You should self-quarantine for 14 days if you have symptoms that could potentially be coronavirus or have been in close contact a with a person diagnosed with COVID-19 within the last 2 weeks. You should avoid contact with people age 65 and older.   You should wear a mask or cloth face covering over your nose and mouth if you must be around other people or animals, including pets (even at home). Try to stay at least 6 feet away from other people. This will protect the people around you.  You can use medication such as A prescription cough medication called Tessalon Perles 100 mg. You may take 1-2 capsules every 8 hours as needed for cough  You may also take  acetaminophen (Tylenol) as needed for fever.   Reduce your risk of any infection by using the same precautions used for avoiding the common cold or flu:  . Wash your hands often with soap and warm water for at least 20 seconds.  If soap and water are not readily available, use an alcohol-based hand sanitizer with at least 60% alcohol.  . If coughing or sneezing, cover your mouth and nose by coughing or sneezing into the elbow areas of your shirt or coat, into a tissue or into your sleeve (not your hands). . Avoid shaking hands with others and consider head nods or verbal greetings only. . Avoid touching your eyes, nose, or mouth with unwashed hands.  . Avoid close contact with people who are sick. . Avoid places or events with large numbers of people in one location, like concerts or sporting events. . Carefully consider travel plans you have or are making. . If you are planning any travel outside or inside the [place], visit the CDC's Travelers' Health webpage for the latest health notices. . If you have some symptoms but not all symptoms, continue to monitor at home and seek medical attention if your symptoms worsen. . If you are having a medical emergency, call 911.  HOME CARE . Only take medications as instructed by your medical team. . Drink plenty of fluids and get plenty of rest. . A steam or ultrasonic humidifier can help if you have congestion.   GET HELP RIGHT AWAY IF YOU HAVE EMERGENCY WARNING SIGNS** FOR COVID-19. If you or someone is showing   any of these signs seek emergency medical care immediately. Call 911 or proceed to your closest emergency facility if: . You develop worsening high fever. . Trouble breathing . Bluish lips or face . Persistent pain or pressure in the chest . New confusion . Inability to wake or stay awake . You cough up blood. . Your symptoms become more severe  **This list is not all possible symptoms. Contact your medical provider for any symptoms that are  sever or concerning to you.   MAKE SURE YOU   Understand these instructions.  Will watch your condition.  Will get help right away if you are not doing well or get worse.  Your e-visit answers were reviewed by a board certified advanced clinical practitioner to complete your personal care plan.  Depending on the condition, your plan could have included both over the counter or prescription medications.  If there is a problem please reply once you have received a response from your provider.  Your safety is important to us.  If you have drug allergies check your prescription carefully.    You can use MyChart to ask questions about today's visit, request a non-urgent call back, or ask for a work or school excuse for 24 hours related to this e-Visit. If it has been greater than 24 hours you will need to follow up with your provider, or enter a new e-Visit to address those concerns. You will get an e-mail in the next two days asking about your experience.  I hope that your e-visit has been valuable and will speed your recovery. Thank you for using e-visits.  5-10 minutes spent reviewing and documenting in chart.   

## 2019-02-28 ENCOUNTER — Encounter: Payer: Self-pay | Admitting: Internal Medicine

## 2019-02-28 ENCOUNTER — Telehealth: Payer: Self-pay | Admitting: General Practice

## 2019-02-28 ENCOUNTER — Other Ambulatory Visit: Payer: Self-pay

## 2019-02-28 ENCOUNTER — Ambulatory Visit (INDEPENDENT_AMBULATORY_CARE_PROVIDER_SITE_OTHER): Payer: Self-pay | Admitting: Internal Medicine

## 2019-02-28 DIAGNOSIS — H6980 Other specified disorders of Eustachian tube, unspecified ear: Secondary | ICD-10-CM

## 2019-02-28 DIAGNOSIS — Z20822 Contact with and (suspected) exposure to covid-19: Secondary | ICD-10-CM

## 2019-02-28 DIAGNOSIS — H9312 Tinnitus, left ear: Secondary | ICD-10-CM

## 2019-02-28 DIAGNOSIS — H6990 Unspecified Eustachian tube disorder, unspecified ear: Secondary | ICD-10-CM

## 2019-02-28 DIAGNOSIS — R058 Other specified cough: Secondary | ICD-10-CM

## 2019-02-28 DIAGNOSIS — R05 Cough: Secondary | ICD-10-CM

## 2019-02-28 DIAGNOSIS — Z20828 Contact with and (suspected) exposure to other viral communicable diseases: Secondary | ICD-10-CM

## 2019-02-28 NOTE — Telephone Encounter (Signed)
lvm for pt at [facility] to return call to scheduled covid testing.

## 2019-02-28 NOTE — Telephone Encounter (Signed)
Pt returned my call to schedule Covid testing.  Pt was referred by: Burnis Medin, MD

## 2019-02-28 NOTE — Addendum Note (Signed)
Addended by: Dimple Nanas on: 02/28/2019 12:26 PM   Modules accepted: Orders

## 2019-03-07 LAB — NOVEL CORONAVIRUS, NAA: SARS-CoV-2, NAA: NOT DETECTED

## 2019-05-16 ENCOUNTER — Telehealth: Payer: Self-pay | Admitting: Internal Medicine

## 2019-05-16 NOTE — Telephone Encounter (Signed)
Called pt and she wanted to also get an pneumonia and tetanus if she is eligible for them and any other vaccines that Dr. Regis Bill would like for her to have.

## 2019-05-20 ENCOUNTER — Telehealth: Payer: Self-pay | Admitting: Internal Medicine

## 2019-05-20 ENCOUNTER — Encounter: Payer: Self-pay | Admitting: Internal Medicine

## 2019-05-20 NOTE — Telephone Encounter (Signed)
lvm for pt to call back crm created

## 2019-05-20 NOTE — Telephone Encounter (Signed)
Okay to disclose  

## 2019-05-20 NOTE — Telephone Encounter (Signed)
See  Immunizations  Due for  Flu vaccine  Td     Pneumovax  Not usually given unless hx of lung disease tobacco use or asthma    Or at age 58   Safe to get the pneumovax 23 if she wishes but  No compelling  indication noted

## 2019-05-20 NOTE — Telephone Encounter (Signed)
Patient returned call and message from Dr Regis Bill 9/17 2020 was read to patient.  She verbalized understanding.  She and her husband are scheduled for immunizations Oct 27th 2020.

## 2019-05-23 ENCOUNTER — Encounter: Payer: Self-pay | Admitting: Internal Medicine

## 2019-05-23 DIAGNOSIS — H6980 Other specified disorders of Eustachian tube, unspecified ear: Secondary | ICD-10-CM

## 2019-05-23 NOTE — Telephone Encounter (Signed)
Advise   referal to [facility] ( previous ) now  [facility] .  Locally .  Please do referral  For her thanks

## 2019-06-25 ENCOUNTER — Encounter: Payer: Self-pay | Admitting: Internal Medicine

## 2019-06-25 ENCOUNTER — Other Ambulatory Visit: Payer: Self-pay

## 2019-06-25 ENCOUNTER — Ambulatory Visit (INDEPENDENT_AMBULATORY_CARE_PROVIDER_SITE_OTHER): Payer: BC Managed Care – PPO

## 2019-06-25 DIAGNOSIS — Z23 Encounter for immunization: Secondary | ICD-10-CM | POA: Diagnosis not present

## 2019-07-02 ENCOUNTER — Other Ambulatory Visit: Payer: Self-pay

## 2019-07-02 ENCOUNTER — Ambulatory Visit (INDEPENDENT_AMBULATORY_CARE_PROVIDER_SITE_OTHER): Payer: BC Managed Care – PPO | Admitting: Otolaryngology

## 2019-07-02 DIAGNOSIS — J310 Chronic rhinitis: Secondary | ICD-10-CM

## 2019-07-02 DIAGNOSIS — H9312 Tinnitus, left ear: Secondary | ICD-10-CM | POA: Diagnosis not present

## 2019-07-02 DIAGNOSIS — H6980 Other specified disorders of Eustachian tube, unspecified ear: Secondary | ICD-10-CM

## 2019-07-02 NOTE — Progress Notes (Signed)
HPI: Destiny Carpenter is a 58 y.o. female who presents is referred by Dr. Fabian Sharp for evaluation of chronic crackling in her ears which she has had apparently since childhood.  She has also complained of some intermittent ringing in the left ear that she has had for the past 6 to 12 months.  She denies any loud noise exposure.  She also frequently gets headaches that she thinks may be sinus type headaches.  But she denies any trouble breathing through her nose and no colored discharge from her nose.  She has previously tried [employer] without much benefit.  She feels like she hears crackling in her ears just about all the time but no real pain in the ears.  She has not really noticed any hearing problems or change in her hearing. She does relate that she has a history of trouble equalizing pressure in ears when she flies..  Past Medical History:  Diagnosis Date  . Anemia   . Family history of other specified malignant neoplasm   . IBS (irritable bowel syndrome)   . MVP (mitral valve prolapse)   . Other and unspecified ovarian cyst   . Raynaud's disease   . UTI (lower urinary tract infection)   . Varicella    Past Surgical History:  Procedure Laterality Date  . TONSILLECTOMY     Social History   Socioeconomic History  . Marital status: Married    Spouse name: Not on file  . Number of children: 2  . Years of education: Not on file  . Highest education level: Not on file  Occupational History  . Not on file  Social Needs  . Financial resource strain: Not on file  . Food insecurity    Worry: Not on file    Inability: Not on file  . Transportation needs    Medical: Not on file    Non-medical: Not on file  Tobacco Use  . Smoking status: Never Smoker  . Smokeless tobacco: Never Used  Substance and Sexual Activity  . Alcohol use: Yes    Comment: 2 glasses of wine per week  . Drug use: No  . Sexual activity: Not on file  Lifestyle  . Physical activity    Days per week: Not on  file    Minutes per session: Not on file  . Stress: Not on file  Relationships  . Social Musician on phone: Not on file    Gets together: Not on file    Attends religious service: Not on file    Active member of club or organization: Not on file    Attends meetings of clubs or organizations: Not on file    Relationship status: Not on file  Other Topics Concern  . 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 Hyperlipidemia Other   . Hypertension Father   . Melanoma Other   . Skin cancer Other   . Heart disease Mother   . Hypertension Mother   . Atrial fibrillation Mother    Allergies  Allergen Reactions  . Dextromethorphan Nausea Only and Other (See Comments)    Got dizzy and passed out.   Prior to Admission medications   Medication Sig Start Date  End Date Taking? Authorizing Provider  benzonatate (TESSALON PERLES) 100 MG capsule Take 1 capsule (100 mg total) by mouth 3 (three) times daily as needed for cough. 02/27/19   Hassell Done, Mary-Margaret, FNP  CALCIUM PO Take 500 Units by mouth daily.     [provider]  Cholecalciferol (VITAMIN D PO) Take 500 mcg by mouth.     [provider]  CYANOCOBALAMIN PO Take 500 Units by mouth daily.     [provider]     Positive ROS: Is otherwise negative  All other systems have been reviewed and were otherwise negative with the exception of those mentioned in the HPI and as above.  Physical Exam: General: Alert, no acute distress Ears: Ear canals are clear bilaterally with intact, clear TMs.  Both TMs have good mobility on pneumatic otoscopy.  No middle ear fluid or abnormality noted.  On hearing screening with a tuning forks she heard slightly better in the right ear compared to the left but minimal difference.  AC > BC  bilaterally. Nasal: Mild rhinitis with clear nasal passages otherwise.  Both middle meatus regions are clear with no evidence of infection. Oral: Clear oropharynx.  She is status post tonsillectomy. Neck: No palpable adenopathy or masses   Assessment: Symptoms are typical of eustachian tube dysfunction although she has a normal examination Left ear tinnitus may be a sign of mild hearing loss as she has slightly better hearing in the right ear on tuning fork testing but might benefit by formal audiologic testing if ringing is persistent.  Presently she states that it comes and goes.  Plan: Recommended regular use of Nasacort 2 sprays each nostril at night as this may provide some eustachian tube relief.  Could also consider use of antihistamines such as Claritin. Reassured her of essentially normal examination otherwise.   Radene Journey, MD   CC: Shanon Ace MD

## 2019-11-25 ENCOUNTER — Other Ambulatory Visit: Payer: Self-pay

## 2019-11-26 ENCOUNTER — Encounter: Payer: Self-pay | Admitting: Internal Medicine

## 2019-11-26 ENCOUNTER — Ambulatory Visit (INDEPENDENT_AMBULATORY_CARE_PROVIDER_SITE_OTHER): Payer: BC Managed Care – PPO | Admitting: Internal Medicine

## 2019-11-26 VITALS — BP 108/70 | HR 71 | Temp 97.2°F | Ht 67.0 in | Wt 129.0 lb

## 2019-11-26 DIAGNOSIS — R399 Unspecified symptoms and signs involving the genitourinary system: Secondary | ICD-10-CM | POA: Diagnosis not present

## 2019-11-26 DIAGNOSIS — Z1159 Encounter for screening for other viral diseases: Secondary | ICD-10-CM

## 2019-11-26 DIAGNOSIS — Z124 Encounter for screening for malignant neoplasm of cervix: Secondary | ICD-10-CM

## 2019-11-26 DIAGNOSIS — Z13220 Encounter for screening for lipoid disorders: Secondary | ICD-10-CM

## 2019-11-26 DIAGNOSIS — Z0000 Encounter for general adult medical examination without abnormal findings: Secondary | ICD-10-CM

## 2019-11-26 NOTE — Progress Notes (Signed)
This visit occurred during the SARS-CoV-2 public health emergency.  Safety protocols were in place, including screening questions prior to the visit, additional usage of staff PPE, and extensive cleaning of exam room while observing appropriate contact time as indicated for disinfecting solutions.    Chief Complaint  Patient presents with  . Annual Exam    HPI: Patient  Destiny Carpenter  59 y.o. comes in today for Preventive Health Care visit  See past notes  ent said ok   Mucous  And self rx for allergy used  Skin left ear  Itchy . Loss of hearing.  Cracking was self induced.    But one ear still itches and bothers her  Less wax an issue  Last menses age  3  Over due for pap  Got delayed from covid shut down. Has some  Urinary sx imcompete void or urgency in day positional  But ok at night .   Health Maintenance  Topic Date Due  . Hepatitis C Screening  Never done  . HIV Screening  Never done  . COLONOSCOPY  Never done  . MAMMOGRAM  03/09/2013  . PAP SMEAR-Modifier  09/19/2014  . TETANUS/TDAP  06/24/2029  . INFLUENZA VACCINE  Completed   Health Maintenance Review LIFESTYLE:  Exercise:  Doing well  5 x per week over 30 minutes running  Knee and hip. Monitoring.  Tobacco/ETS: no Alcohol:  Once a night in pandemic and then off  Sugar beverages:  Honey in teaone coke  Sleep: 7-8 hours  Drug use: no HH of  3 and dog Work:   ROS:  Thumbs bases hurt and prominent at times  adpts motion and rest  rx  GEN/ HEENT: No fever, significant weight changes sweats headaches vision problems hearing changes, CV/ PULM; No chest pain shortness of breath cough, syncope,edema  change in exercise tolerance. GI /GU: No adominal pain, vomiting, change in bowel habits. No blood in the stool.see hpi SKIN/HEME: ,no acute skin rashes suspicious lesions or bleeding. No lymphadenopathy, nodules, masses.  NEURO/ PSYCH:  No neurologic signs such as weakness numbness. No depression anxiety. IMM/  Allergy: No unusual infections.  Allergy .   REST of 12 system review negative except as per HPI   Past Medical History:  Diagnosis Date  . Anemia   . Family history of other specified malignant neoplasm   . IBS (irritable bowel syndrome)   . MVP (mitral valve prolapse)   . Other and unspecified ovarian cyst   . Raynaud's disease   . UTI (lower urinary tract infection)   . Varicella     Past Surgical History:  Procedure Laterality Date  . TONSILLECTOMY      Family History  Problem Relation Age of Onset  . Hyperlipidemia Other   . Hypertension Father   . Melanoma Other   . Skin cancer Other   . Heart disease Mother   . Hypertension Mother   . Atrial fibrillation Mother     Social History   Socioeconomic History  . Marital status: Married    Spouse name: Not on file  . Number of children: 2  . Years of education: Not on file  . Highest education level: Not on file  Occupational History  . Not on file  Tobacco Use  . Smoking status: Never Smoker  . Smokeless tobacco: Never Used  Substance and Sexual Activity  . Alcohol use: Yes    Comment: 2 glasses of wine per week  . Drug use:  No  . Sexual activity: Not on file  Other Topics Concern  . 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 Difficulty of Paying Living Expenses:   Food Insecurity:   . Worried About Programme researcher, broadcasting/film/video in the Last Year:   . Barista in the Last Year:   Transportation Needs:   . Freight forwarder (Medical):   Marland Kitchen Lack of Transportation (Non-Medical):   Physical Activity:   . Days of Exercise per Week:   . Minutes of Exercise per Session:   Stress:   . Feeling of Stress :   Social Connections:   . Frequency of Communication with Friends and Family:   . Frequency of Social  Gatherings with Friends and Family:   . Attends Religious Services:   . Active Member of Clubs or Organizations:   . Attends Banker Meetings:   Marland Kitchen Marital Status:     Outpatient Medications Prior to Visit  Medication Sig Dispense Refill  . benzonatate (TESSALON PERLES) 100 MG capsule Take 1 capsule (100 mg total) by mouth 3 (three) times daily as needed for cough. 20 capsule 0  . CALCIUM PO Take 500 Units by mouth daily.     . Cholecalciferol (VITAMIN D PO) Take 500 mcg by mouth.     . CYANOCOBALAMIN PO Take 500 Units by mouth daily.      No facility-administered medications prior to visit.     EXAM:  BP 108/70   Pulse 71   Temp (!) 97.2 F (36.2 C) (Other (Comment))   Ht 5\' 7"  (1.702 m)   Wt 129 lb (58.5 kg)   SpO2 99%   BMI 20.20 kg/m   Body mass index is 20.2 kg/m. Wt Readings from Last 3 Encounters:  11/26/19 129 lb (58.5 kg)  07/10/18 123 lb (55.8 kg)  06/19/18 122 lb 9.6 oz (55.6 kg)    Physical Exam: Vital signs reviewed 06/21/18 is a well-developed well-nourished alert cooperative    who appearsr stated age in no acute distress. Slender habitus  HEENT: normocephalic atraumatic , Eyes: PERRL EOM's full, conjunctiva clear, Nares: paten,t no deformity discharge or tenderness., Ears: no deformity EAC's clear irritated no debri  TMs with normal landmarks. Mouth: clear OP, masked  NECK: supple without masses, thyromegaly or bruits. CHEST/PULM:  Clear to auscultation and percussion breath sounds equal no wheeze , rales or rhonchi. No chest wall deformities or tenderness. Breast: normal by inspection . No dimpling, discharge, masses, tenderness or discharge . CV: PMI is nondisplaced, S1 S2 no gallops, murmurs, rubs. Peripheral pulses are full without delay.No JVD .  ABDOMEN: Bowel sounds normal nontender  No guard or rebound, no hepato splenomegal no CVA tenderness.  No hernia. easily palpated  abd aorta and  Iliac   Slender habitus  Extremtities:  No  clubbing cyanosis or edema, no acute joint swelling or redness no focal atrophy thumb base some  NEURO:  Oriented x3, cranial nerves 3-12 appear to be intact, no obvious focal weakness,gait within normal limits no abnormal reflexes or asymmetrical SKIN: No acute rashes normal turgor, color, no bruising or petechiae. PSYCH: Oriented, good eye contact, no obvious depression anxiety, cognition and judgment appear normal. LN:  no cervical axillary inguinal adenopathy Pelvic: NL ext GU, labia clear without lesions or rash . Urethra  ectropian partial no mass Vagina no lesions .Cervix: clear  UTERUS: Neg CMT Adnexa:  clear no masses . PAP done with hpv    Lab Results  Component Value Date   WBC 4.3 06/19/2018   HGB 13.3 06/19/2018   HCT 39.3 06/19/2018   PLT 193.0 06/19/2018   GLUCOSE 70 06/19/2018   CHOL 164 06/19/2018   TRIG 91.0 06/19/2018   HDL 83.30 06/19/2018   LDLCALC 62 06/19/2018   ALT 27 06/19/2018   AST 22 06/19/2018   NA 139 06/19/2018   K 4.4 06/19/2018   CL 101 06/19/2018   CREATININE 0.91 06/19/2018   BUN 13 06/19/2018   CO2 33 (H) 06/19/2018   TSH 1.65 06/19/2018    BP Readings from Last 3 Encounters:  11/26/19 108/70  07/10/18 (!) 88/64  06/19/18 (!) 120/58    Lab plan  reviewed with patient   ASSESSMENT AND PLAN:  Discussed the following assessment and plan:    ICD-10-CM   1. Visit for preventive health examination  Z00.00 PAP [[place]]    POCT Urinalysis Dipstick (Automated)    PAP, Thin Prep w/HPV rflx HPV Type 16/18    Basic metabolic panel    CBC with Differential/Platelet    Hepatic function panel    Lipid panel    TSH    TSH    Lipid panel    Hepatic function panel    CBC with Differential/Platelet    Basic metabolic panel    CANCELED: Basic metabolic panel    CANCELED: CBC with Differential/Platelet    CANCELED: Hepatic function panel    CANCELED: Lipid panel    CANCELED: TSH  2. Screening for cervical cancer  Z12.4 PAP [[place]]      POCT Urinalysis Dipstick (Automated)    PAP, Thin Prep w/HPV rflx HPV Type 16/18    Basic metabolic panel    CBC with Differential/Platelet    Hepatic function panel    Lipid panel    TSH    TSH    Lipid panel    Hepatic function panel    CBC with Differential/Platelet    Basic metabolic panel    CANCELED: Basic metabolic panel    CANCELED: CBC with Differential/Platelet    CANCELED: Hepatic function panel    CANCELED: Lipid panel    CANCELED: TSH  3. Lower urinary tract symptoms (LUTS)  R39.9 POCT Urinalysis Dipstick (Automated)    POC Urinalysis Dipstick    Basic metabolic panel    CBC with Differential/Platelet    Hepatic function panel    Lipid panel    TSH    TSH    Lipid panel    Hepatic function panel    CBC with Differential/Platelet    Basic metabolic panel    CANCELED: Basic metabolic panel    CANCELED: CBC with Differential/Platelet    CANCELED: Hepatic function panel    CANCELED: Lipid panel    CANCELED: TSH  4. Screening, lipid  Z13.220 Lipid panel    Lipid panel    CANCELED: Lipid panel  5. Need for hepatitis C screening test  Z11.59 Hepatitis C antibody    Hepatitis C antibody    CANCELED: Hepatitis C antibody   Return for depending on results and how doing every 1-2 years. Colon cancer screening cologuard  Had  In fall of 2017 and is due  Will do  fu later no sx  Gyne exam is normal   Urethral opening  With ectropian no mass and open   If any concerns could do [place] and or  See urology   Lab today  Patient Care Team: Britainy Kozub, Standley Brooking, MD as PCP - General [place], Amy, MD as Consulting Physician (Dermatology) Lelon Perla, MD (Cardiology) Patient Instructions   Health Maintenance Due  Topic Date Due  . Hepatitis C Screening  Never done  . HIV Screening  Never done  . COLONOSCOPY  Never done  . MAMMOGRAM  03/09/2013  . PAP SMEAR-Modifier  09/19/2014    Depression screen PHQ 2/9 06/19/2018  Decreased Interest 0  Down, Depressed, Hopeless  0  PHQ - 2 Score 0   Will notify you  of labs when available.  Try altering   Diet for bladder sx and then   Let us know   Consider further evaluation but your exam is good .    Health Maintenance, Female Adopting a healthy lifestyle and getting preventive care are important in promoting health and wellness. Ask your health care provider about:  The right schedule for you to have regular tests and exams.  Things you can do on your own to prevent diseases and keep yourself healthy. What should I know about diet, weight, and exercise? Eat a healthy diet   Eat a diet that includes plenty of vegetables, fruits, low-fat dairy products, and lean protein.  Do not eat a lot of foods that are high in solid fats, added sugars, or sodium. Maintain a healthy weight Body mass index (BMI) is used to identify weight problems. It estimates body fat based on height and weight. Your health care provider can help determine your BMI and help you achieve or maintain a healthy weight. Get regular exercise Get regular exercise. This is one of the most important things you can do for your health. Most adults should:  Exercise for at least 150 minutes each week. The exercise should increase your heart rate and make you sweat (moderate-intensity exercise).  Do strengthening exercises at least twice a week. This is in addition to the moderate-intensity exercise.  Spend less time sitting. Even light physical activity can be beneficial. Watch cholesterol and blood lipids Have your blood tested for lipids and cholesterol at 57 years of age, then have this test every 5 years. Have your cholesterol levels checked more often if:  Your lipid or cholesterol levels are high.  You are older than 57 years of age.  You are at high risk for heart disease. What should I know about cancer screening? Depending on your health history and family history, you may need to have cancer screening at various ages. This may  include screening for:  Breast cancer.  Cervical cancer.  Colorectal cancer.  Skin cancer.  Lung cancer. What should I know about heart disease, diabetes, and high blood pressure? Blood pressure and heart disease  High blood pressure causes heart disease and increases the risk of stroke. This is more likely to develop in people who have high blood pressure readings, are of African descent, or are overweight.  Have your blood pressure checked: ? Every 3-5 years if you are 23-76 years of age. ? Every year if you are 32 years old or older. Diabetes Have regular diabetes screenings. This checks your fasting blood sugar level. Have the screening done:  Once every three years after age 50 if you are at a normal weight and have a  low risk for diabetes.  More often and at a younger age if you are overweight or have a high risk for diabetes. What should I know about preventing infection? Hepatitis B If you have a higher risk for hepatitis B, you should be screened for this virus. Talk with your health care provider to find out if you are at risk for hepatitis B infection. Hepatitis C Testing is recommended for:  Everyone born from [birth year] through 1963.  Anyone with known risk factors for hepatitis C. Sexually transmitted infections (STIs)  Get screened for STIs, including gonorrhea and chlamydia, if: ? You are sexually active and are younger than 58 years of age. ? You are older than 57 years of age and your health care provider tells you that you are at risk for this type of infection. ? Your sexual activity has changed since you were last screened, and you are at increased risk for chlamydia or gonorrhea. Ask your health care provider if you are at risk.  Ask your health care provider about whether you are at high risk for HIV. Your health care provider may recommend a prescription medicine to help prevent HIV infection. If you choose to take medicine to prevent HIV, you should first  get tested for HIV. You should then be tested every 3 months for as long as you are taking the medicine. Pregnancy  If you are about to stop having your period (premenopausal) and you may become pregnant, seek counseling before you get pregnant.  Take 400 to 800 micrograms (mcg) of folic acid every day if you become pregnant.  Ask for birth control (contraception) if you want to prevent pregnancy. Osteoporosis and menopause Osteoporosis is a disease in which the bones lose minerals and strength with aging. This can result in bone fractures. If you are 57 years old or older, or if you are at risk for osteoporosis and fractures, ask your health care provider if you should:  Be screened for bone loss.  Take a calcium or vitamin D supplement to lower your risk of fractures.  Be given hormone replacement therapy (HRT) to treat symptoms of menopause. Follow these instructions at home: Lifestyle  Do not use any products that contain nicotine or tobacco, such as cigarettes, e-cigarettes, and chewing tobacco. If you need help quitting, ask your health care provider.  Do not use street drugs.  Do not share needles.  Ask your health care provider for help if you need support or information about quitting drugs. Alcohol use  Do not drink alcohol if: ? Your health care provider tells you not to drink. ? You are pregnant, may be pregnant, or are planning to become pregnant.  If you drink alcohol: ? Limit how much you use to 0-1 drink a day. ? Limit intake if you are breastfeeding.  Be aware of how much alcohol is in your drink. In the [place] one drink equals one 12 oz bottle of beer (355 mL), one 5 oz glass of wine (148 mL), or one 1 oz glass of hard liquor (44 mL). General instructions  Schedule regular health, dental, and eye exams.  Stay current with your vaccines.  Tell your health care provider if: ? You often feel depressed. ? You have ever been abused or do not feel safe at  home. Summary  Adopting a healthy lifestyle and getting preventive care are important in promoting health and wellness.  Follow your health care provider's instructions about healthy diet, exercising, and getting tested or  screened for diseases.  Follow your health care provider's instructions on monitoring your cholesterol and blood pressure. This information is not intended to replace advice given to you by your health care provider. Make sure you discuss any questions you have with your health care provider. Document Revised: 08/08/2018 Document Reviewed: 08/08/2018 Elsevier Patient Education  2020 [employer].    McAllister K. Allis Quirarte M.D.   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

## 2019-11-26 NOTE — Patient Instructions (Addendum)
Health Maintenance Due  Topic Date Due  . Hepatitis C Screening  Never done  . HIV Screening  Never done  . COLONOSCOPY  Never done  . MAMMOGRAM  03/09/2013  . PAP SMEAR-Modifier  09/19/2014    Depression screen PHQ 2/9 06/19/2018  Decreased Interest 0  Down, Depressed, Hopeless 0  PHQ - 2 Score 0   Will notify you  of labs when available.  Try altering   Diet for bladder sx and then   Let us know   Consider further evaluation but your exam is good .    Health Maintenance, Female Adopting a healthy lifestyle and getting preventive care are important in promoting health and wellness. Ask your health care provider about:  The right schedule for you to have regular tests and exams.  Things you can do on your own to prevent diseases and keep yourself healthy. What should I know about diet, weight, and exercise? Eat a healthy diet   Eat a diet that includes plenty of vegetables, fruits, low-fat dairy products, and lean protein.  Do not eat a lot of foods that are high in solid fats, added sugars, or sodium. Maintain a healthy weight Body mass index (BMI) is used to identify weight problems. It estimates body fat based on height and weight. Your health care provider can help determine your BMI and help you achieve or maintain a healthy weight. Get regular exercise Get regular exercise. This is one of the most important things you can do for your health. Most adults should:  Exercise for at least 150 minutes each week. The exercise should increase your heart rate and make you sweat (moderate-intensity exercise).  Do strengthening exercises at least twice a week. This is in addition to the moderate-intensity exercise.  Spend less time sitting. Even light physical activity can be beneficial. Watch cholesterol and blood lipids Have your blood tested for lipids and cholesterol at 57 years of age, then have this test every 5 years. Have your cholesterol levels checked more often  if:  Your lipid or cholesterol levels are high.  You are older than 57 years of age.  You are at high risk for heart disease. What should I know about cancer screening? Depending on your health history and family history, you may need to have cancer screening at various ages. This may include screening for:  Breast cancer.  Cervical cancer.  Colorectal cancer.  Skin cancer.  Lung cancer. What should I know about heart disease, diabetes, and high blood pressure? Blood pressure and heart disease  High blood pressure causes heart disease and increases the risk of stroke. This is more likely to develop in people who have high blood pressure readings, are of African descent, or are overweight.  Have your blood pressure checked: ? Every 3-5 years if you are 4-20 years of age. ? Every year if you are 51 years old or older. Diabetes Have regular diabetes screenings. This checks your fasting blood sugar level. Have the screening done:  Once every three years after age 18 if you are at a normal weight and have a low risk for diabetes.  More often and at a younger age if you are overweight or have a high risk for diabetes. What should I know about preventing infection? Hepatitis B If you have a higher risk for hepatitis B, you should be screened for this virus. Talk with your health care provider to find out if you are at risk for hepatitis B infection.  Hepatitis C Testing is recommended for:  Everyone born from [birth year] through 1965.  Anyone with known risk factors for hepatitis C. Sexually transmitted infections (STIs)  Get screened for STIs, including gonorrhea and chlamydia, if: ? You are sexually active and are younger than 57 years of age. ? You are older than 57 years of age and your health care provider tells you that you are at risk for this type of infection. ? Your sexual activity has changed since you were last screened, and you are at increased risk for chlamydia or  gonorrhea. Ask your health care provider if you are at risk.  Ask your health care provider about whether you are at high risk for HIV. Your health care provider may recommend a prescription medicine to help prevent HIV infection. If you choose to take medicine to prevent HIV, you should first get tested for HIV. You should then be tested every 3 months for as long as you are taking the medicine. Pregnancy  If you are about to stop having your period (premenopausal) and you may become pregnant, seek counseling before you get pregnant.  Take 400 to 800 micrograms (mcg) of folic acid every day if you become pregnant.  Ask for birth control (contraception) if you want to prevent pregnancy. Osteoporosis and menopause Osteoporosis is a disease in which the bones lose minerals and strength with aging. This can result in bone fractures. If you are 58 years old or older, or if you are at risk for osteoporosis and fractures, ask your health care provider if you should:  Be screened for bone loss.  Take a calcium or vitamin D supplement to lower your risk of fractures.  Be given hormone replacement therapy (HRT) to treat symptoms of menopause. Follow these instructions at home: Lifestyle  Do not use any products that contain nicotine or tobacco, such as cigarettes, e-cigarettes, and chewing tobacco. If you need help quitting, ask your health care provider.  Do not use street drugs.  Do not share needles.  Ask your health care provider for help if you need support or information about quitting drugs. Alcohol use  Do not drink alcohol if: ? Your health care provider tells you not to drink. ? You are pregnant, may be pregnant, or are planning to become pregnant.  If you drink alcohol: ? Limit how much you use to 0-1 drink a day. ? Limit intake if you are breastfeeding.  Be aware of how much alcohol is in your drink. In the [place] one drink equals one 12 oz bottle of beer (355 mL), one 5 oz  glass of wine (148 mL), or one 1 oz glass of hard liquor (44 mL). General instructions  Schedule regular health, dental, and eye exams.  Stay current with your vaccines.  Tell your health care provider if: ? You often feel depressed. ? You have ever been abused or do not feel safe at home. Summary  Adopting a healthy lifestyle and getting preventive care are important in promoting health and wellness.  Follow your health care provider's instructions about healthy diet, exercising, and getting tested or screened for diseases.  Follow your health care provider's instructions on monitoring your cholesterol and blood pressure. This information is not intended to replace advice given to you by your health care provider. Make sure you discuss any questions you have with your health care provider. Document Revised: 08/08/2018 Document Reviewed: 08/08/2018 Elsevier Patient Education  2020 [employer].

## 2019-11-27 LAB — HEPATIC FUNCTION PANEL
AG Ratio: 2.3 (calc) (ref 1.0–2.5)
ALT: 22 U/L (ref 6–29)
AST: 24 U/L (ref 10–35)
Albumin: 5 g/dL (ref 3.6–5.1)
Alkaline phosphatase (APISO): 47 U/L (ref 37–153)
Bilirubin, Direct: 0.2 mg/dL (ref 0.0–0.2)
Globulin: 2.2 g/dL (calc) (ref 1.9–3.7)
Indirect Bilirubin: 0.6 mg/dL (calc) (ref 0.2–1.2)
Total Bilirubin: 0.8 mg/dL (ref 0.2–1.2)
Total Protein: 7.2 g/dL (ref 6.1–8.1)

## 2019-11-27 LAB — POCT URINALYSIS DIPSTICK
Bilirubin, UA: NEGATIVE
Blood, UA: NEGATIVE
Glucose, UA: NEGATIVE
Ketones, UA: NEGATIVE
Leukocytes, UA: NEGATIVE
Nitrite, UA: NEGATIVE
Protein, UA: NEGATIVE
Spec Grav, UA: 1.01 (ref 1.010–1.025)
Urobilinogen, UA: 0.2 E.U./dL
pH, UA: 6 (ref 5.0–8.0)

## 2019-11-27 LAB — CBC WITH DIFFERENTIAL/PLATELET
Absolute Monocytes: 446 cells/uL (ref 200–950)
Basophils Absolute: 38 cells/uL (ref 0–200)
Basophils Relative: 0.8 %
Eosinophils Absolute: 91 cells/uL (ref 15–500)
Eosinophils Relative: 1.9 %
HCT: 42.5 % (ref 35.0–45.0)
Hemoglobin: 14.2 g/dL (ref 11.7–15.5)
Lymphs Abs: 845 cells/uL — ABNORMAL LOW (ref 850–3900)
MCH: 30.9 pg (ref 27.0–33.0)
MCHC: 33.4 g/dL (ref 32.0–36.0)
MCV: 92.4 fL (ref 80.0–100.0)
MPV: 10.7 fL (ref 7.5–12.5)
Monocytes Relative: 9.3 %
Neutro Abs: 3379 cells/uL (ref 1500–7800)
Neutrophils Relative %: 70.4 %
Platelets: 221 10*3/uL (ref 140–400)
RBC: 4.6 10*6/uL (ref 3.80–5.10)
RDW: 12.5 % (ref 11.0–15.0)
Total Lymphocyte: 17.6 %
WBC: 4.8 10*3/uL (ref 3.8–10.8)

## 2019-11-27 LAB — BASIC METABOLIC PANEL
BUN: 14 mg/dL (ref 7–25)
CO2: 27 mmol/L (ref 20–32)
Calcium: 10.1 mg/dL (ref 8.6–10.4)
Chloride: 100 mmol/L (ref 98–110)
Creat: 0.99 mg/dL (ref 0.50–1.05)
Glucose, Bld: 93 mg/dL (ref 65–99)
Potassium: 4.6 mmol/L (ref 3.5–5.3)
Sodium: 139 mmol/L (ref 135–146)

## 2019-11-27 LAB — TSH: TSH: 1.51 mIU/L (ref 0.40–4.50)

## 2019-11-27 LAB — LIPID PANEL
Cholesterol: 180 mg/dL (ref <200)
HDL: 95 mg/dL (ref >=50)
LDL Cholesterol (Calc): 69 mg/dL (calc)
Non-HDL Cholesterol (Calc): 85 mg/dL (calc) (ref <130)
Total CHOL/HDL Ratio: 1.9 (calc) (ref <5.0)
Triglycerides: 77 mg/dL (ref <150)

## 2019-11-27 LAB — HEPATITIS C ANTIBODY
Hepatitis C Ab: NONREACTIVE
SIGNAL TO CUT-OFF: 0.01 (ref <1.00)

## 2019-11-27 NOTE — Progress Notes (Signed)
Results so far are good  thyroid kidney urine blood count and  cholesterol

## 2019-11-28 LAB — PAP, TP IMAGING W/ HPV RNA, RFLX HPV TYPE 16,18/45: HPV DNA High Risk: NOT DETECTED

## 2019-12-02 NOTE — Telephone Encounter (Signed)
So sorry you had an anxiety producing experience . Will forward concerns  to our management .  In regard to blood count   Not a concern at all . Yes keep me posted about the urinary   Symptoms

## 2019-12-03 NOTE — Progress Notes (Signed)
PAP is normal. HPV high  risk is negative

## 2020-03-04 ENCOUNTER — Other Ambulatory Visit: Payer: Self-pay | Admitting: Internal Medicine

## 2020-03-04 DIAGNOSIS — Z1231 Encounter for screening mammogram for malignant neoplasm of breast: Secondary | ICD-10-CM

## 2020-03-12 ENCOUNTER — Ambulatory Visit
Admission: RE | Admit: 2020-03-12 | Discharge: 2020-03-12 | Disposition: A | Discharge status: Home / Self Care | Payer: BC Managed Care – PPO | Source: Ambulatory Visit | Attending: Internal Medicine | Admitting: Internal Medicine

## 2020-03-12 ENCOUNTER — Other Ambulatory Visit: Payer: Self-pay

## 2020-03-12 DIAGNOSIS — Z1231 Encounter for screening mammogram for malignant neoplasm of breast: Secondary | ICD-10-CM

## 2021-08-02 NOTE — Progress Notes (Signed)
Karlyne Greenspan MD Reason for referral-mitral valve prolapse and history of syncope  HPI: 60 year old female for evaluation of mitral valve prolapse and history of syncope.  Patient seen previously but not since 2019.  Stress echo 10/05 normal.  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  Patient denies dyspnea on exertion, orthopnea, PND, pedal edema or syncope.  Recently she has noticed increased palpitations.  She initially noticed these predominantly at night.  They would wake her from sleep and she felt her heart "pounding".  This would typically last 5 minutes and resolve.  More recently she has noticed these during the day at times where she can feel her heartbeat in her ears.  Cardiology now asked to evaluate.  No current outpatient medications on file.   No current facility-administered medications for this visit.    Allergies  Allergen Reactions   Dextromethorphan Nausea Only and Other (See Comments)    Got dizzy and passed out.     Past Medical History:  Diagnosis Date   Anemia    Family history of other specified malignant neoplasm    IBS (irritable bowel syndrome)    MVP (mitral valve prolapse)    Other and unspecified ovarian cyst    Raynaud's disease    UTI (lower urinary tract infection)    Varicella     Past Surgical History:  Procedure Laterality Date   TONSILLECTOMY      Social History   Socioeconomic History   Marital status: Married    Spouse name: Not on file   Number of children: 2   Years of education: Not on file   Highest education level: Not on file  Occupational History   Not on file  Tobacco Use   Smoking status: Never   Smokeless tobacco: Never  Substance and Sexual Activity   Alcohol use: Yes    Comment: 1- 2 glasses  wine 5 x per week   Drug use: No   Sexual activity: Not on file  Other Topics Concern   Not on file  Social History Narrative   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   Part time not working now   husband Art gallery manager self employed   Social Determinants of Corporate investment banker Strain: Not on Ship broker Insecurity: Not on file  Transportation Needs: Not on file  Physical Activity: Not on file  Stress: Not on file  Social Connections: Not on file  Intimate Partner Violence: Not on file    Family History  Problem Relation Age of Onset   Hyperlipidemia Other    Hypertension Father    Melanoma Other    Skin cancer Other    Heart disease Mother    Hypertension Mother    Atrial fibrillation Mother     ROS: no fevers or chills, productive cough, hemoptysis, dysphasia, odynophagia, melena, hematochezia, dysuria, hematuria, rash, seizure activity, orthopnea, PND, pedal edema, claudication. Remaining systems are negative.  Physical Exam:   Blood pressure 94/64, pulse 72, height 5\' 8"  (1.727 m), weight 122 lb 6.4 oz (55.5 kg), SpO2 100 %.  General:  Well developed/well nourished in NAD Skin warm/dry Patient not depressed No peripheral clubbing Back-normal HEENT-normal/normal eyelids Neck supple/normal carotid upstroke bilaterally; no bruits; no JVD; no thyromegaly  chest - CTA/ normal expansion CV - RRR/normal S1 and S2; no murmurs, rubs or gallops;  PMI nondisplaced Abdomen -NT/ND, no HSM, no mass, + bowel sounds, no bruit 2+ femoral pulses, no bruits Ext-no edema, chords, 2+ DP Neuro-grossly nonfocal  ECG -normal sinus rhythm at a rate of 72, right axis deviation, RV conduction delay, occasional PVC, nonspecific ST changes.  Personally reviewed  A/P  1 palpitations-PVC noted on ECG but not clearly correlating with symptoms.  We will arrange a 7-day Zio patch to further assess.  If she has significant ectopy we could consider beta-blockade  in the future if necessary.  We will plan to repeat echocardiogram.  2 history of syncope-previous episode felt likely vagal in etiology.  LV function normal.  No recurrences.  3 mitral valve prolapse-most recent echocardiogram showed no mitral regurgitation.    4 history of chest pain-no recurrences since previous evaluation.  Kirk Ruths, MD

## 2021-08-09 ENCOUNTER — Ambulatory Visit: Payer: Self-pay | Admitting: Cardiology

## 2021-08-09 ENCOUNTER — Ambulatory Visit (INDEPENDENT_AMBULATORY_CARE_PROVIDER_SITE_OTHER): Payer: BC Managed Care – PPO

## 2021-08-09 ENCOUNTER — Ambulatory Visit (INDEPENDENT_AMBULATORY_CARE_PROVIDER_SITE_OTHER): Payer: BC Managed Care – PPO | Admitting: Cardiology

## 2021-08-09 ENCOUNTER — Encounter: Payer: Self-pay | Admitting: Cardiology

## 2021-08-09 ENCOUNTER — Other Ambulatory Visit: Payer: Self-pay

## 2021-08-09 VITALS — BP 94/64 | HR 72 | Ht 68.0 in | Wt 122.4 lb

## 2021-08-09 DIAGNOSIS — I059 Rheumatic mitral valve disease, unspecified: Secondary | ICD-10-CM

## 2021-08-09 DIAGNOSIS — R002 Palpitations: Secondary | ICD-10-CM | POA: Diagnosis not present

## 2021-08-09 NOTE — Progress Notes (Unsigned)
Enrolled for [employer] to mail a ZIO XT long term holter monitor to the patients address on file.  

## 2021-08-09 NOTE — Patient Instructions (Signed)
Testing/Procedures:  Your physician has requested that you have an echocardiogram. Echocardiography is a painless test that uses sound waves to create images of your heart. It provides your doctor with information about the size and shape of your heart and how well your heart's chambers and valves are working. This procedure takes approximately one hour. There are no restrictions for this procedure. [address]  ZIO XT- Long Term Monitor Instructions  Your physician has requested you wear a ZIO patch monitor for 7 days.  This is a single patch monitor. [employer] supplies one patch monitor per enrollment. Additional stickers are not available. Please do not apply patch if you will be having a Nuclear Stress Test,  Echocardiogram, Cardiac CT, MRI, or Chest Xray during the period you would be wearing the  monitor. The patch cannot be worn during these tests. You cannot remove and re-apply the  ZIO XT patch monitor.  Your ZIO patch monitor will be mailed 3 day USPS to your address on file. It may take 3-5 days  to receive your monitor after you have been enrolled.  Once you have received your monitor, please review the enclosed instructions. Your monitor  has already been registered assigning a specific monitor serial # to you.  Billing and Patient Assistance Program Information  We have supplied [employer] with any of your insurance information on file for billing purposes. [employer] offers a sliding scale Patient Assistance Program for patients that do not have  insurance, or whose insurance does not completely cover the cost of the ZIO monitor.  You must apply for the Patient Assistance Program to qualify for this discounted rate.  To apply, please call [employer] at [phone], select option 4, select option 2, ask to apply for  Patient Assistance Program. Meredeth Ide will ask your household income, and how many people  are in your household. They will quote your out-of-pocket cost based  on that information.  [employer] will also be able to set up a 67-month, interest-free payment plan if needed.  Applying the monitor   Shave hair from upper left chest.  Hold abrader disc by orange tab. Rub abrader in 40 strokes over the upper left chest as  indicated in your monitor instructions.  Clean area with 4 enclosed alcohol pads. Let dry.  Apply patch as indicated in monitor instructions. Patch will be placed under collarbone on left  side of chest with arrow pointing upward.  Rub patch adhesive wings for 2 minutes. Remove white label marked "1". Remove the white  label marked "2". Rub patch adhesive wings for 2 additional minutes.  While looking in a mirror, press and release button in center of patch. A small green light will  flash 3-4 times. This will be your only indicator that the monitor has been turned on.  Do not shower for the first 24 hours. You may shower after the first 24 hours.  Press the button if you feel a symptom. You will hear a small click. Record Date, Time and  Symptom in the Patient Logbook.  When you are ready to remove the patch, follow instructions on the last 2 pages of Patient  Logbook. Stick patch monitor onto the last page of Patient Logbook.  Place Patient Logbook in the blue and white box. Use locking tab on box and tape box closed  securely. The blue and white box has prepaid postage on it. Please place it in the mailbox as  soon as possible. Your physician should have your  test results approximately 7 days after the  monitor has been mailed back to [facility].  Call [facility] Customer Care at [phone] if you have questions regarding  your ZIO XT patch monitor. Call them immediately if you see an orange light blinking on your  monitor.  If your monitor falls off in less than 4 days, contact our Monitor department at [phone].  If your monitor becomes loose or falls off after 4 days call [employer] at [phone] for  suggestions  on securing your monitor    Follow-Up: At [facility], you and your health needs are our priority.  As part of our continuing mission to provide you with exceptional heart care, we have created designated Provider Care Teams.  These Care Teams include your primary Cardiologist (physician) and Advanced Practice Providers (APPs -  Physician Assistants and Nurse Practitioners) who all work together to provide you with the care you need, when you need it.  We recommend signing up for the patient portal called "MyChart".  Sign up information is provided on this After Visit Summary.  MyChart is used to connect with patients for Virtual Visits (Telemedicine).  Patients are able to view lab/test results, encounter notes, upcoming appointments, etc.  Non-urgent messages can be sent to your provider as well.   To learn more about what you can do with MyChart, go to [email or web address].    Your next appointment:   6 month(s)  The format for your next appointment:   In Person  Provider:   Olga Millers MD

## 2021-08-12 ENCOUNTER — Encounter: Payer: Self-pay | Admitting: Cardiology

## 2021-08-23 DIAGNOSIS — R002 Palpitations: Secondary | ICD-10-CM

## 2021-08-25 ENCOUNTER — Other Ambulatory Visit (HOSPITAL_COMMUNITY): Payer: BC Managed Care – PPO

## 2021-08-31 ENCOUNTER — Ambulatory Visit (HOSPITAL_COMMUNITY): Payer: BC Managed Care – PPO | Attending: Cardiovascular Disease

## 2021-08-31 ENCOUNTER — Other Ambulatory Visit: Payer: Self-pay

## 2021-08-31 DIAGNOSIS — I059 Rheumatic mitral valve disease, unspecified: Secondary | ICD-10-CM | POA: Diagnosis not present

## 2021-08-31 LAB — ECHOCARDIOGRAM COMPLETE
Area-P 1/2: 3.27 cm2
S' Lateral: 2.6 cm

## 2021-09-02 ENCOUNTER — Telehealth: Payer: Self-pay | Admitting: *Deleted

## 2021-09-02 ENCOUNTER — Encounter: Payer: Self-pay | Admitting: *Deleted

## 2021-09-02 DIAGNOSIS — Q248 Other specified congenital malformations of heart: Secondary | ICD-10-CM

## 2021-09-02 NOTE — Telephone Encounter (Signed)
pt aware of results  Lab orders mailed to the pt  Order placed  Instructions for cardiac MRI sent to patient via my chart.

## 2021-09-02 NOTE — Telephone Encounter (Signed)
-----   Message from Lewayne Bunting, MD sent at 08/31/2021  2:14 PM EST ----- I have reviewed the echo; effusion appears to be small; schedule cardiac MRI to R/O pericardial cyst Olga Millers

## 2021-09-20 NOTE — Progress Notes (Signed)
Chief Complaint  Patient presents with   Annual Exam    fasting    HPI: Patient  Destiny Carpenter  61 y.o. comes in today for Preventive Health Care visit  Needs labs done also for cardiology Dr. Jens Som sent to [facility].  No major changes in health since cut out alcohol and caffeine to see how she does. Generally well healthy lifestyle. Due for colon cancer screening Cologuard would be acceptable at this time negative first-degree relative family or personal history. Dx mvp palpitiations following  to get mri cardiac soon   Health Maintenance  Topic Date Due   HIV Screening  Never done   Fecal DNA (Cologuard)  03/21/2022 (Originally 07/27/2019)   Zoster Vaccines- Shingrix (1 of 2) 03/21/2022 (Originally 12/19/2012)   MAMMOGRAM  03/12/2022   PAP SMEAR-Modifier  11/26/2022   TETANUS/TDAP  06/24/2029   INFLUENZA VACCINE  Completed   Hepatitis C Screening  Completed   HPV VACCINES  Aged Out   Health Maintenance Review LIFESTYLE:  Exercise:  4 x per weeks  40 brisk walk  Tobacco/ETS: n Alcohol:  off now  Sugar beverages:  none  off caffiene  water  Sleep: 7.5  Drug use: no HH of   2 and dog  Work:    ROS:  REST of 12 system review negative except as per HPI Mom with history of recurrent meningiomas she may like an evaluation in the future does get some non pulsitile tinnitus uncertain if related to hearing.   Past Medical History:  Diagnosis Date   Anemia    Family history of other specified malignant neoplasm    IBS (irritable bowel syndrome)    MVP (mitral valve prolapse)    Other and unspecified ovarian cyst    Raynaud's disease    UTI (lower urinary tract infection)    Varicella     Past Surgical History:  Procedure Laterality Date   TONSILLECTOMY      Family History  Problem Relation Age of Onset   Hyperlipidemia Other    Hypertension Father    Melanoma Other    Skin cancer Other    Heart disease Mother    Hypertension Mother     Atrial fibrillation Mother     Social History   Socioeconomic History   Marital status: Married    Spouse name: Not on file   Number of children: 2   Years of education: Not on file   Highest education level: Not on file  Occupational History   Not on file  Tobacco Use   Smoking status: Never   Smokeless tobacco: Never  Substance and Sexual Activity   Alcohol use: Yes    Comment: 1- 2 glasses wine 5 x per week   Drug use: No   Sexual activity: Not on file  Other Topics Concern   Not on file  Social History Narrative   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   No facility-administered medications prior to visit.     EXAM:  BP 120/80 (  BP Location: Left Arm, Patient Position: Sitting, Cuff Size: Normal)    Pulse 60    Temp 98 F (36.7 C) (Oral)    Ht 5\' 8"  (1.727 m)    Wt 122 lb 6.4 oz (55.5 kg)    LMP 01/28/2016 (Within Weeks)    SpO2 98%    BMI 18.61 kg/m   Body mass index is 18.61 kg/m. Wt Readings from Last 3 Encounters:  09/21/21 122 lb 6.4 oz (55.5 kg)  08/09/21 122 lb 6.4 oz (55.5 kg)  11/26/19 129 lb (58.5 kg)    Physical Exam: Vital signs reviewed 11/28/19 is a well-developed slender well-nourished alert cooperative    who appearsr stated age in no acute distress.  HEENT: normocephalic atraumatic , Eyes: PERRL EOM's full, conjunctiva clear, Nares: paten,t no deformity discharge or tenderness., Ears: no deformity EAC's clear TMs with normal landmarks. Mouth: Masked NECK: supple without masses, or bruits. CHEST/PULM:  Clear to auscultation and percussion breath sounds equal no wheeze , rales or  rhonchi. No chest wall deformities or tenderness. Breast: normal by inspection . No dimpling, discharge, masses, tenderness or discharge . CV: PMI is nondisplaced, S1 S2 no gallops, murmurs, rubs. Peripheral pulses are full without delay.No JVD .  ABDOMEN: Bowel sounds normal nontender  No guard or rebound, no hepato splenomegal no CVA tenderness.   Extremtities:  No clubbing cyanosis or edema, no acute joint swelling or redness no focal atrophy NEURO:  Oriented x3, cranial nerves 3-12 appear to be intact, no obvious focal weakness,gait within normal limits no abnormal reflexes SKIN: No acute rashes normal turgor, color, no bruising or petechiae. PSYCH: Oriented, good eye contact, no obvious depression anxiety, cognition and judgment appear normal. LN: no cervical axillary adenopathy  Lab Results  Component Value Date   WBC 4.8 11/26/2019   HGB 14.2 11/26/2019   HCT 42.5 11/26/2019   PLT 221 11/26/2019   GLUCOSE 93 11/26/2019   CHOL 180 11/26/2019   TRIG 77 11/26/2019   HDL 95 11/26/2019   LDLCALC 69 11/26/2019   ALT 22 11/26/2019   AST 24 11/26/2019   NA 139 11/26/2019   K 4.6 11/26/2019   CL 100 11/26/2019   CREATININE 0.99 11/26/2019   BUN 14 11/26/2019   CO2 27 11/26/2019   TSH 1.51 11/26/2019    BP Readings from Last 3 Encounters:  09/21/21 120/80  08/09/21 94/64  11/26/19 108/70    Lab plan reviewed with patient   ASSESSMENT AND PLAN:  Discussed the following assessment and plan:    ICD-10-CM   1. Visit for preventive health examination  Z00.00 Basic metabolic panel    CBC with Differential/Platelet    Hepatic function panel    Lipid panel    TSH    TSH    Lipid panel    Hepatic function panel    CBC with Differential/Platelet    Basic metabolic panel    2. Screening, lipid  Z13.220     3. Colon cancer screening  Z12.11 Cologuard    Lab  to be sent to [facility] for insurance  reasons . Will share with Dr 11/28/19 .  Return in about 1 year (around  09/21/2022) for depending on results.  Patient Care Team: Jenalee Trevizo, 09/23/2022, MD as PCP - General Neta Mends, Amy, MD as Consulting Physician (Dermatology) [place], MD (Cardiology) Patient Instructions  Good to see you today . Lab today will share with dr Lewayne Bunting.   NF gene  can be assoc  with   meningiomas  but this usually noted in children.at dx .   Continue lifestyle intervention healthy eating and exercise .  Will order  cologuard screening .    Neta MendsWanda K. Deen Deguia M.D.

## 2021-09-21 ENCOUNTER — Ambulatory Visit (INDEPENDENT_AMBULATORY_CARE_PROVIDER_SITE_OTHER): Payer: BC Managed Care – PPO | Admitting: Internal Medicine

## 2021-09-21 ENCOUNTER — Encounter: Payer: Self-pay | Admitting: Internal Medicine

## 2021-09-21 VITALS — BP 120/80 | HR 60 | Temp 98.0°F | Ht 68.0 in | Wt 122.4 lb

## 2021-09-21 DIAGNOSIS — Z13220 Encounter for screening for lipoid disorders: Secondary | ICD-10-CM | POA: Diagnosis not present

## 2021-09-21 DIAGNOSIS — Z1211 Encounter for screening for malignant neoplasm of colon: Secondary | ICD-10-CM | POA: Diagnosis not present

## 2021-09-21 DIAGNOSIS — Z0000 Encounter for general adult medical examination without abnormal findings: Secondary | ICD-10-CM

## 2021-09-21 NOTE — Patient Instructions (Signed)
Good to see you today . Lab today will share with dr Stanford Breed.   NF gene  can be assoc with   meningiomas  but this usually noted in children.at dx .   Continue lifestyle intervention healthy eating and exercise .  Will order  cologuard screening .

## 2021-09-22 LAB — LIPID PANEL
Chol/HDL Ratio: 1.8 ratio (ref 0.0–4.4)
Cholesterol, Total: 189 mg/dL (ref 100–199)
HDL: 105 mg/dL (ref >39)
LDL Chol Calc (NIH): 73 mg/dL (ref 0–99)
Triglycerides: 56 mg/dL (ref 0–149)
VLDL Cholesterol Cal: 11 mg/dL (ref 5–40)

## 2021-09-22 LAB — CBC WITH DIFFERENTIAL/PLATELET
Basophils Absolute: 0.1 10*3/uL (ref 0.0–0.2)
Basos: 1 %
EOS (ABSOLUTE): 0.1 10*3/uL (ref 0.0–0.4)
Eos: 2 %
Hematocrit: 42.8 % (ref 34.0–46.6)
Hemoglobin: 14.2 g/dL (ref 11.1–15.9)
Immature Grans (Abs): 0 10*3/uL (ref 0.0–0.1)
Immature Granulocytes: 0 %
Lymphocytes Absolute: 1.1 10*3/uL (ref 0.7–3.1)
Lymphs: 20 %
MCH: 30.4 pg (ref 26.6–33.0)
MCHC: 33.2 g/dL (ref 31.5–35.7)
MCV: 92 fL (ref 79–97)
Monocytes Absolute: 0.5 10*3/uL (ref 0.1–0.9)
Monocytes: 9 %
Neutrophils Absolute: 3.7 10*3/uL (ref 1.4–7.0)
Neutrophils: 68 %
Platelets: 220 10*3/uL (ref 150–450)
RBC: 4.67 x10E6/uL (ref 3.77–5.28)
RDW: 12.4 % (ref 11.7–15.4)
WBC: 5.4 10*3/uL (ref 3.4–10.8)

## 2021-09-22 LAB — HEPATIC FUNCTION PANEL
ALT: 40 IU/L — ABNORMAL HIGH (ref 0–32)
AST: 30 IU/L (ref 0–40)
Albumin: 5.2 g/dL — ABNORMAL HIGH (ref 3.8–4.9)
Alkaline Phosphatase: 60 IU/L (ref 44–121)
Bilirubin Total: 0.9 mg/dL (ref 0.0–1.2)
Bilirubin, Direct: 0.21 mg/dL (ref 0.00–0.40)
Total Protein: 7.4 g/dL (ref 6.0–8.5)

## 2021-09-22 LAB — BASIC METABOLIC PANEL
BUN/Creatinine Ratio: 13 (ref 9–23)
BUN: 14 mg/dL (ref 6–24)
CO2: 26 mmol/L (ref 20–29)
Calcium: 10.1 mg/dL (ref 8.7–10.2)
Chloride: 99 mmol/L (ref 96–106)
Creatinine, Ser: 1.1 mg/dL — ABNORMAL HIGH (ref 0.57–1.00)
Glucose: 84 mg/dL (ref 70–99)
Potassium: 4.8 mmol/L (ref 3.5–5.2)
Sodium: 139 mmol/L (ref 134–144)
eGFR: 58 mL/min/{1.73_m2} — ABNORMAL LOW (ref >59)

## 2021-09-22 LAB — TSH: TSH: 1.89 u[IU]/mL (ref 0.450–4.500)

## 2021-09-23 ENCOUNTER — Encounter: Payer: Self-pay | Admitting: Internal Medicine

## 2021-09-24 ENCOUNTER — Other Ambulatory Visit: Payer: Self-pay | Admitting: Internal Medicine

## 2021-09-24 DIAGNOSIS — R7989 Other specified abnormal findings of blood chemistry: Secondary | ICD-10-CM

## 2021-09-24 DIAGNOSIS — E875 Hyperkalemia: Secondary | ICD-10-CM

## 2021-09-24 NOTE — Progress Notes (Signed)
Potassium borderline elevated  could be  related to blood draw effect and hydration.  Mild elevation  alt (usually  a liver test)  advise  avoid limit meds such as tylenol advil alcohol supplements if any and repeat  LFts and bmp and  update hep c serology  after 3-4 weeks ( hydrated ) .  make lab appt)  forwarding labs to dr Jens Som

## 2021-09-24 NOTE — Progress Notes (Signed)
Orders placed.

## 2021-09-27 ENCOUNTER — Telehealth: Payer: Self-pay | Admitting: Cardiology

## 2021-09-27 NOTE — Telephone Encounter (Signed)
Spoke with pt regarding recent lab work. Will forward to Dr. Jens Som for his recommendations. Pt also wanted to make sure labs were ok for cardiac MRI on 2/2. Will send message for Dr. Jens Som to review pt's labs. Pt verbalizes understanding.

## 2021-09-27 NOTE — Telephone Encounter (Signed)
Patient states she had lab work with her PCP on 1/24 and she would like to have Dr. Stanford Breed review the results. She would like to confirm whether these labs can be used for 02/02 MRI. Please advise.

## 2021-09-28 ENCOUNTER — Telehealth (HOSPITAL_COMMUNITY): Payer: Self-pay | Admitting: *Deleted

## 2021-09-28 NOTE — Telephone Encounter (Signed)
Spoke with pt, Aware of dr crenshaw's recommendations.  °

## 2021-09-28 NOTE — Telephone Encounter (Signed)
Reaching out to patient to offer assistance regarding upcoming cardiac imaging study; pt verbalizes understanding of appt date/time, parking situation and where to check in, and verified current allergies; name and call back number provided for further questions should they arise  Kattleya Kuhnert RN Navigator Cardiac Imaging [facility] [phone] office [phone] cell  Patient denies claustrophobia but does report dental implants. 

## 2021-09-30 ENCOUNTER — Ambulatory Visit (HOSPITAL_COMMUNITY)
Admission: RE | Admit: 2021-09-30 | Discharge: 2021-09-30 | Disposition: A | Discharge status: Home / Self Care | Payer: BC Managed Care – PPO | Source: Ambulatory Visit | Attending: Cardiology | Admitting: Cardiology

## 2021-09-30 ENCOUNTER — Other Ambulatory Visit: Payer: Self-pay

## 2021-09-30 DIAGNOSIS — Q248 Other specified congenital malformations of heart: Secondary | ICD-10-CM | POA: Insufficient documentation

## 2021-09-30 MED ORDER — GADOBUTROL 1 MMOL/ML IV SOLN
6.0000 mL | Freq: Once | INTRAVENOUS | Status: AC | PRN
  Administered 2021-09-30: 6 mL via INTRAVENOUS

## 2021-10-04 ENCOUNTER — Encounter: Payer: Self-pay | Admitting: Cardiology

## 2021-10-04 LAB — COLOGUARD: COLOGUARD: NEGATIVE

## 2021-10-05 NOTE — Telephone Encounter (Signed)
Fasting not needed  you just need to be  hydrated . Yes  wait 3-4 weeks from last test to five some time 2.   Yes same hep c but repeat  because of lab results .  3.  Ok  just wanted to make sure not causing     the lab  results .

## 2021-10-08 NOTE — Progress Notes (Signed)
Cologuard negative; repeat in 3 years;  

## 2021-10-19 ENCOUNTER — Other Ambulatory Visit (INDEPENDENT_AMBULATORY_CARE_PROVIDER_SITE_OTHER): Payer: BC Managed Care – PPO

## 2021-10-19 DIAGNOSIS — E875 Hyperkalemia: Secondary | ICD-10-CM

## 2021-10-19 DIAGNOSIS — R7989 Other specified abnormal findings of blood chemistry: Secondary | ICD-10-CM

## 2021-10-19 NOTE — Addendum Note (Signed)
Addended by: Natale Thoma D on: 10/19/2021 08:28 AM ° ° Modules accepted: Orders ° °

## 2021-10-19 NOTE — Addendum Note (Signed)
Addended by: Rosalyn Gess D on: 10/19/2021 08:28 AM   Modules accepted: Orders

## 2021-10-20 ENCOUNTER — Encounter: Payer: Self-pay | Admitting: Internal Medicine

## 2021-10-20 LAB — BASIC METABOLIC PANEL
BUN/Creatinine Ratio: 13 (ref 9–23)
BUN: 12 mg/dL (ref 6–24)
CO2: 27 mmol/L (ref 20–29)
Calcium: 9.6 mg/dL (ref 8.7–10.2)
Chloride: 101 mmol/L (ref 96–106)
Creatinine, Ser: 0.94 mg/dL (ref 0.57–1.00)
Glucose: 87 mg/dL (ref 70–99)
Potassium: 4.2 mmol/L (ref 3.5–5.2)
Sodium: 139 mmol/L (ref 134–144)
eGFR: 70 mL/min/{1.73_m2} (ref >59)

## 2021-10-20 LAB — HEPATITIS B SURFACE ANTIGEN: Hepatitis B Surface Ag: NEGATIVE

## 2021-10-20 LAB — HEPATITIS C ANTIBODY: Hep C Virus Ab: NONREACTIVE

## 2021-10-20 LAB — HEPATIC FUNCTION PANEL
ALT: 34 IU/L — ABNORMAL HIGH (ref 0–32)
AST: 36 IU/L (ref 0–40)
Albumin: 5.2 g/dL — ABNORMAL HIGH (ref 3.8–4.9)
Alkaline Phosphatase: 57 IU/L (ref 44–121)
Bilirubin Total: 0.8 mg/dL (ref 0.0–1.2)
Bilirubin, Direct: 0.22 mg/dL (ref 0.00–0.40)
Total Protein: 7 g/dL (ref 6.0–8.5)

## 2021-10-20 LAB — HEPATITIS B SURFACE ANTIBODY,QUALITATIVE: Hep B Surface Ab, Qual: NONREACTIVE

## 2021-10-26 ENCOUNTER — Telehealth: Payer: Self-pay

## 2021-10-26 NOTE — Telephone Encounter (Signed)
Patient called with question about lab results patient asked if there is something she can read about her liver and why her numbers are elevated. Patient stated message can be release to MyChart

## 2021-10-28 NOTE — Telephone Encounter (Signed)
I sent her a message

## 2021-12-21 ENCOUNTER — Other Ambulatory Visit (INDEPENDENT_AMBULATORY_CARE_PROVIDER_SITE_OTHER): Payer: BC Managed Care – PPO

## 2021-12-21 ENCOUNTER — Other Ambulatory Visit: Payer: Self-pay

## 2021-12-21 DIAGNOSIS — R7989 Other specified abnormal findings of blood chemistry: Secondary | ICD-10-CM | POA: Diagnosis not present

## 2021-12-21 LAB — HEPATIC FUNCTION PANEL
ALT: 28 U/L (ref 0–35)
AST: 27 U/L (ref 0–37)
Albumin: 4.6 g/dL (ref 3.5–5.2)
Alkaline Phosphatase: 49 U/L (ref 39–117)
Bilirubin, Direct: 0.1 mg/dL (ref 0.0–0.3)
Total Bilirubin: 0.8 mg/dL (ref 0.2–1.2)
Total Protein: 6.8 g/dL (ref 6.0–8.3)

## 2021-12-21 NOTE — Addendum Note (Signed)
Addended by: Mady Gemma on: 12/21/2021 08:33 AM ? ? Modules accepted: Orders ? ?

## 2021-12-21 NOTE — Progress Notes (Signed)
Liver tests are now normal. Routine follow

## 2022-01-28 ENCOUNTER — Encounter: Payer: Self-pay | Admitting: Internal Medicine

## 2022-02-04 NOTE — Progress Notes (Deleted)
HPI:FU mitral valve prolapse and history of syncope.  Stress echo 10/05 normal.  Echocardiogram January 2023 showed normal LV function, moderate pericardial effusion without evidence of tamponade, possible pericardial cyst, mild prolapse of the anterior mitral valve leaflet with trace mitral regurgitation and trace aortic insufficiency.  Monitor January 2023 showed sinus rhythm with occasional PAC, brief runs of ectopic atrial tachycardia and rare PVC.  Cardiac MRI February 2023 showed moderate pericardial effusion but no pericardial cyst,, enlarged right atrium, normal LV function and RV function and no infiltrative disease.  Since last seen  No current outpatient medications on file.   No current facility-administered medications for this visit.     Past Medical History:  Diagnosis Date   Anemia    Family history of other specified malignant neoplasm    IBS (irritable bowel syndrome)    MVP (mitral valve prolapse)    Other and unspecified ovarian cyst    Raynaud's disease    UTI (lower urinary tract infection)    Varicella     Past Surgical History:  Procedure Laterality Date   TONSILLECTOMY      Social History   Socioeconomic History   Marital status: Married    Spouse name: Not on file   Number of children: 2   Years of education: Not on file   Highest education level: Not on file  Occupational History   Not on file  Tobacco Use   Smoking status: Never   Smokeless tobacco: Never  Substance and Sexual Activity   Alcohol use: Yes    Comment: 1- 2 glasses wine 5 x per week   Drug use: No   Sexual activity: Not on file  Other Topics Concern   Not on file  Social History Narrative   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   Part time not working now   husband Art gallery manager self employed   Social Determinants of Corporate investment banker Strain: Not on Ship broker Insecurity: Not on file  Transportation Needs: Not on file   Physical Activity: Not on file  Stress: Not on file  Social Connections: Not on file  Intimate Partner Violence: Not on file    Family History  Problem Relation Age of Onset   Hyperlipidemia Other    Hypertension Father    Melanoma Other    Skin cancer Other    Heart disease Mother    Hypertension Mother    Atrial fibrillation Mother     ROS: no fevers or chills, productive cough, hemoptysis, dysphasia, odynophagia, melena, hematochezia, dysuria, hematuria, rash, seizure activity, orthopnea, PND, pedal edema, claudication. Remaining systems are negative.  Physical Exam: Well-developed well-nourished in no acute distress.  Skin is warm and dry.  HEENT is normal.  Neck is supple.  Chest is clear to auscultation with normal expansion.  Cardiovascular exam is regular rate and rhythm.  Abdominal exam nontender or distended. No masses palpated. Extremities show no edema. neuro grossly intact  ECG- personally reviewed  A/P  1 palpitations-no significant arrhythmias on recent monitor and LV function is normal.  We can consider adding a beta-blocker in the future if symptoms worsen.  2 moderate pericardial effusion-no tamponade physiology.  Noted to have small pericardial effusion in 2020.  Will need follow-up echoes in the future.  3 history of syncope-previous episode felt likely to be vagal in etiology.  She has had no  recurrences.  4 history of mitral valve prolapse-noted on follow-up echocardiogram.  Only trace mitral regurgitation.  Olga Millers, MD

## 2022-02-16 ENCOUNTER — Ambulatory Visit: Payer: BC Managed Care – PPO | Admitting: Cardiology

## 2022-02-24 ENCOUNTER — Encounter: Payer: Self-pay | Admitting: Internal Medicine

## 2022-07-05 NOTE — Progress Notes (Signed)
HPI: FU mitral valve prolapse and history of syncope. Stress echo 10/05 normal. Echocardiogram January 2023 showed normal LV function, moderate pericardial effusion, possible pericardial cyst, mild prolapse of the anterior mitral valve leaflet.  Monitor January 2023 showed sinus rhythm with occasional PAC, brief runs of ectopic atrial tachycardia and rare PVC.  Cardiac MRI February 2023 showed normal LV function and moderate pericardial effusion; right atrial enlargement also noted.  Since last seen she denies dyspnea, chest pain or syncope.  Her palpitations worsen when she has alcohol but has otherwise been well-controlled.  No current outpatient medications on file.   No current facility-administered medications for this visit.     Past Medical History:  Diagnosis Date   Anemia    Family history of other specified malignant neoplasm    IBS (irritable bowel syndrome)    MVP (mitral valve prolapse)    Other and unspecified ovarian cyst    Raynaud's disease    UTI (lower urinary tract infection)    Varicella     Past Surgical History:  Procedure Laterality Date   TONSILLECTOMY      Social History   Socioeconomic History   Marital status: Married    Spouse name: Not on file   Number of children: 2   Years of education: Not on file   Highest education level: Not on file  Occupational History   Not on file  Tobacco Use   Smoking status: Never   Smokeless tobacco: Never  Substance and Sexual Activity   Alcohol use: Yes    Comment: 1- 2 glasses wine 5 x per week   Drug use: No   Sexual activity: Not on file  Other Topics Concern   Not on file  Social History Narrative   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   Public affairs consultant   PhD Math   Part time not working now   husband Chief Financial Officer self employed   Social Determinants of Radio broadcast assistant Strain: Not on Art therapist Insecurity: Not on file  Transportation Needs: Not on file  Physical Activity: Not  on file  Stress: Not on file  Social Connections: Not on file  Intimate Partner Violence: Not on file    Family History  Problem Relation Age of Onset   Hyperlipidemia Other    Hypertension Father    Melanoma Other    Skin cancer Other    Heart disease Mother    Hypertension Mother    Atrial fibrillation Mother     ROS: no fevers or chills, productive cough, hemoptysis, dysphasia, odynophagia, melena, hematochezia, dysuria, hematuria, rash, seizure activity, orthopnea, PND, pedal edema, claudication. Remaining systems are negative.  Physical Exam: Well-developed well-nourished in no acute distress.  Skin is warm and dry.  HEENT is normal.  Neck is supple.  Chest is clear to auscultation with normal expansion.  Cardiovascular exam is regular rate and rhythm.  Abdominal exam nontender or distended. No masses palpated. Extremities show no edema. neuro grossly intact  ECG-normal sinus rhythm at a rate of 65, RV conduction delay, nonspecific ST changes.  Personally reviewed  A/P  1 palpitations-typically associated with alcohol.  She will avoid.  She would like to avoid beta-blockade if possible.  2 history of moderate pericardial effusion-we will repeat echocardiogram.  3 history of syncope-previous episodes felt likely to be vagal in etiology with no recurrences.  LV function normal.  We will follow.  4 history  of mitral valve prolapse-most recent echocardiogram showed trace mitral regurgitation.  Kirk Ruths, MD

## 2022-07-19 ENCOUNTER — Encounter: Payer: Self-pay | Admitting: Cardiology

## 2022-07-19 ENCOUNTER — Ambulatory Visit: Payer: BC Managed Care – PPO | Attending: Cardiology | Admitting: Cardiology

## 2022-07-19 VITALS — BP 104/64 | HR 65 | Ht 67.0 in | Wt 121.0 lb

## 2022-07-19 DIAGNOSIS — R002 Palpitations: Secondary | ICD-10-CM

## 2022-07-19 DIAGNOSIS — I3139 Other pericardial effusion (noninflammatory): Secondary | ICD-10-CM | POA: Diagnosis not present

## 2022-07-19 DIAGNOSIS — I059 Rheumatic mitral valve disease, unspecified: Secondary | ICD-10-CM

## 2022-07-19 NOTE — Patient Instructions (Signed)
  Testing/Procedures:  Your physician has requested that you have an echocardiogram. Echocardiography is a painless test that uses sound waves to create images of your heart. It provides your doctor with information about the size and shape of your heart and how well your heart's chambers and valves are working. This procedure takes approximately one hour. There are no restrictions for this procedure. Please do NOT wear cologne, perfume, aftershave, or lotions (deodorant is allowed). Please arrive 15 minutes prior to your appointment time. [facility]   Follow-Up: At [facility], you and your health needs are our priority.  As part of our continuing mission to provide you with exceptional heart care, we have created designated Provider Care Teams.  These Care Teams include your primary Cardiologist (physician) and Advanced Practice Providers (APPs -  Physician Assistants and Nurse Practitioners) who all work together to provide you with the care you need, when you need it.  We recommend signing up for the patient portal called "MyChart".  Sign up information is provided on this After Visit Summary.  MyChart is used to connect with patients for Virtual Visits (Telemedicine).  Patients are able to view lab/test results, encounter notes, upcoming appointments, etc.  Non-urgent messages can be sent to your provider as well.   To learn more about what you can do with MyChart, go to [email or web address].    Your next appointment:   12 month(s)  The format for your next appointment:   In Person  Provider:   Olga Millers MD

## 2022-08-08 ENCOUNTER — Telehealth: Payer: Self-pay | Admitting: Cardiology

## 2022-08-08 NOTE — Telephone Encounter (Signed)
Spoke to patient-she had to cancel her echo and reschedule.   Her rescheduled date is out of the auth date range (11/28-12/27).  Advised would forward to precert/auth team.  Patient aware.

## 2022-08-08 NOTE — Telephone Encounter (Signed)
Pt called requesting auth and echo request be sent in again to make sure it is covered by insurance. She had to reschedule and would like the referral updated.

## 2022-08-09 ENCOUNTER — Other Ambulatory Visit (HOSPITAL_BASED_OUTPATIENT_CLINIC_OR_DEPARTMENT_OTHER): Payer: BC Managed Care – PPO

## 2022-09-07 ENCOUNTER — Ambulatory Visit (INDEPENDENT_AMBULATORY_CARE_PROVIDER_SITE_OTHER): Payer: BC Managed Care – PPO

## 2022-09-07 DIAGNOSIS — I3139 Other pericardial effusion (noninflammatory): Secondary | ICD-10-CM | POA: Diagnosis not present

## 2022-09-07 LAB — ECHOCARDIOGRAM COMPLETE
Area-P 1/2: 3.17 cm2
Est EF: 60
S' Lateral: 2.08 cm

## 2022-12-15 ENCOUNTER — Other Ambulatory Visit: Payer: Self-pay | Admitting: Internal Medicine

## 2022-12-15 DIAGNOSIS — Z0000 Encounter for general adult medical examination without abnormal findings: Secondary | ICD-10-CM

## 2023-01-20 ENCOUNTER — Ambulatory Visit
Admission: RE | Admit: 2023-01-20 | Discharge: 2023-01-20 | Disposition: A | Discharge status: Home / Self Care | Payer: BC Managed Care – PPO | Source: Ambulatory Visit | Attending: Internal Medicine | Admitting: Internal Medicine

## 2023-01-20 DIAGNOSIS — Z0000 Encounter for general adult medical examination without abnormal findings: Secondary | ICD-10-CM

## 2023-01-26 ENCOUNTER — Other Ambulatory Visit: Payer: Self-pay | Admitting: Internal Medicine

## 2023-01-26 DIAGNOSIS — R928 Other abnormal and inconclusive findings on diagnostic imaging of breast: Secondary | ICD-10-CM

## 2023-02-03 ENCOUNTER — Ambulatory Visit
Admission: RE | Admit: 2023-02-03 | Discharge: 2023-02-03 | Disposition: A | Discharge status: Home / Self Care | Payer: BC Managed Care – PPO | Source: Ambulatory Visit | Attending: Internal Medicine | Admitting: Internal Medicine

## 2023-02-03 DIAGNOSIS — R928 Other abnormal and inconclusive findings on diagnostic imaging of breast: Secondary | ICD-10-CM

## 2023-06-27 IMAGING — MR MR CARD MORPHOLOGY WO/W CM
45 of 48 series · 45 of 48 positions shown · IV contrast (gadavist)
Comparison: none

CLINICAL DATA: Pericardial abnormality

EXAM:
CARDIAC MRI
TECHNIQUE: The patient was scanned on a 1.5 Tesla GE magnet. A dedicated
cardiac coil was used. Functional imaging was done using Fiesta
sequences. [DATE], and 4 chamber views were done to assess for RWMA's.
Modified Lis-Marie rule using a short axis stack was used to
calculate an ejection fraction on a dedicated work station using
Circle software. The patient received 8 cc of Gadavist. After 10
minutes inversion recovery sequences were used to assess for
infiltration and scar tissue.

[Series 4: [id number] · axial · 8.0mm · 1.41mm/px · 1 of 18 slices shown]
[im 1/18]
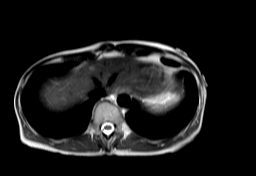

[Series 8: cine_trufi_cs_rt_short axis · sagittal · 8.0mm · 1.92mm/px · 1 of 51 slices shown (1 of 26)]
[im 1/51]
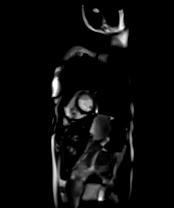

[Series 8: cine_trufi_cs_rt_short axis · sagittal · 8.0mm · 1.92mm/px · 1 of 51 slices shown (2 of 26)]
[im 1/51]
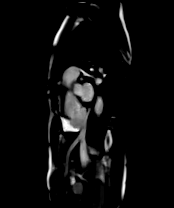

[Series 8: cine_trufi_cs_rt_short axis · sagittal · 8.0mm · 1.92mm/px · 1 of 51 slices shown (3 of 26)]
[im 1/51]
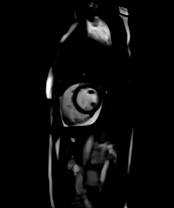

[Series 8: cine_trufi_cs_rt_short axis · sagittal · 8.0mm · 1.92mm/px · 1 of 51 slices shown (4 of 26)]
[im 1/51]
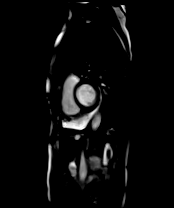

[Series 8: cine_trufi_cs_rt_short axis · sagittal · 8.0mm · 1.92mm/px · 1 of 51 slices shown (5 of 26)]
[im 1/51]
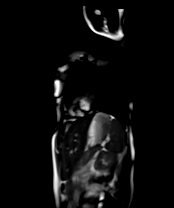

[Series 8: cine_trufi_cs_rt_short axis · sagittal · 8.0mm · 1.92mm/px · 1 of 51 slices shown (6 of 26)]
[im 1/51]
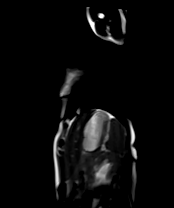

[Series 8: cine_trufi_cs_rt_short axis · sagittal · 8.0mm · 1.92mm/px · 1 of 51 slices shown (7 of 26)]
[im 1/51]
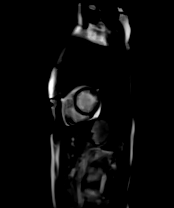

[Series 8: cine_trufi_cs_rt_short axis · sagittal · 8.0mm · 1.92mm/px · 1 of 51 slices shown (8 of 26)]
[im 1/51]
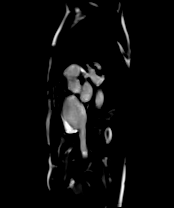

[Series 8: cine_trufi_cs_rt_short axis · sagittal · 8.0mm · 1.92mm/px · 1 of 51 slices shown (9 of 26)]
[im 1/51]
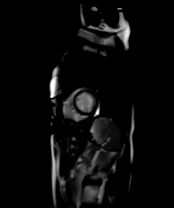

[Series 8: cine_trufi_cs_rt_short axis · sagittal · 8.0mm · 1.92mm/px · 1 of 51 slices shown (10 of 26)]
[im 1/51]
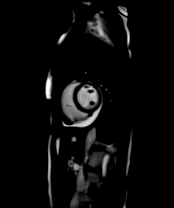

[Series 8: cine_trufi_cs_rt_short axis · sagittal · 8.0mm · 1.92mm/px · 1 of 51 slices shown (11 of 26)]
[im 1/51]
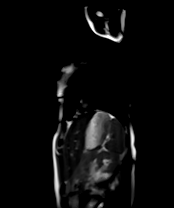

[Series 8: cine_trufi_cs_rt_short axis · sagittal · 8.0mm · 1.92mm/px · 1 of 51 slices shown (12 of 26)]
[im 1/51]
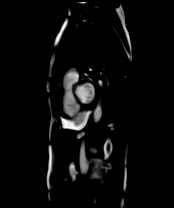

[Series 8: cine_trufi_cs_rt_short axis · sagittal · 8.0mm · 1.92mm/px · 1 of 51 slices shown (13 of 26)]
[im 1/51]
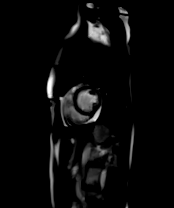

[Series 8: cine_trufi_cs_rt_short axis · sagittal · 8.0mm · 1.92mm/px · 1 of 51 slices shown (14 of 26)]
[im 1/51]
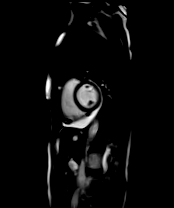

[Series 9: cine_trufi_cs_rt_short axis · oblique · 8.0mm · 1.92mm/px · 1 of 51 slices shown (15 of 26)]
[im 1/51]
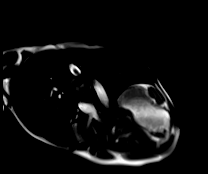

[Series 9: cine_trufi_cs_rt_short axis · oblique · 8.0mm · 1.92mm/px · 1 of 51 slices shown (16 of 26)]
[im 1/51]
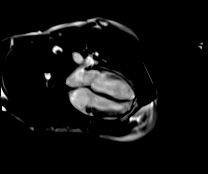

[Series 9: cine_trufi_cs_rt_short axis · oblique · 8.0mm · 1.92mm/px · 1 of 51 slices shown (17 of 26)]
[im 1/51]
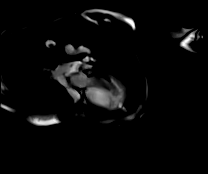

[Series 9: cine_trufi_cs_rt_short axis · oblique · 8.0mm · 1.92mm/px · 1 of 51 slices shown (18 of 26)]
[im 1/51]
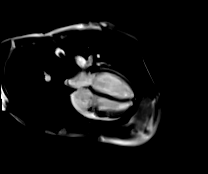

[Series 9: cine_trufi_cs_rt_short axis · oblique · 8.0mm · 1.92mm/px · 1 of 51 slices shown (19 of 26)]
[im 1/51]
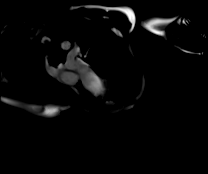

[Series 9: cine_trufi_cs_rt_short axis · oblique · 8.0mm · 1.92mm/px · 1 of 51 slices shown (20 of 26)]
[im 1/51]
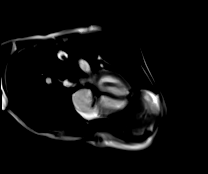

[Series 9: cine_trufi_cs_rt_short axis · oblique · 8.0mm · 1.92mm/px · 1 of 51 slices shown (21 of 26)]
[im 1/51]
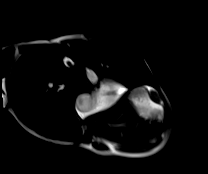

[Series 9: cine_trufi_cs_rt_short axis · oblique · 8.0mm · 1.92mm/px · 1 of 51 slices shown (22 of 26)]
[im 1/51]
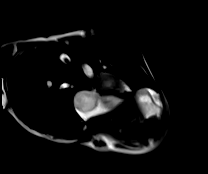

[Series 9: cine_trufi_cs_rt_short axis · oblique · 8.0mm · 1.92mm/px · 1 of 51 slices shown (23 of 26)]
[im 1/51]
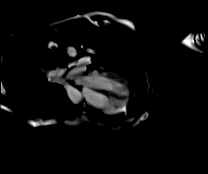

[Series 9: cine_trufi_cs_rt_short axis · oblique · 8.0mm · 1.92mm/px · 1 of 51 slices shown (24 of 26)]
[im 1/51]
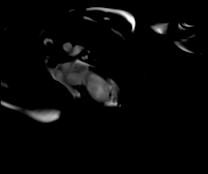

[Series 9: cine_trufi_cs_rt_short axis · oblique · 8.0mm · 1.92mm/px · 1 of 51 slices shown (25 of 26)]
[im 1/51]
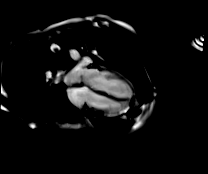

[Series 9: cine_trufi_cs_rt_short axis · oblique · 8.0mm · 1.92mm/px · 1 of 51 slices shown (26 of 26)]
[im 1/51]
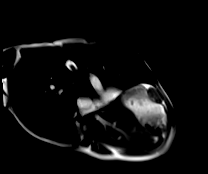

[Series 10: bSSFP · oblique · 8.0mm · 1.61mm/px · 1 of 25 slices shown (1 of 18)]
[im 1/25]
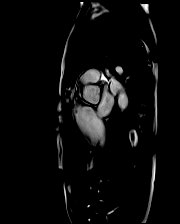

[Series 11: bSSFP · oblique · 8.0mm · 1.61mm/px · 1 of 25 slices shown (2 of 18)]
[im 1/25]
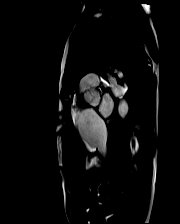

[Series 12: bSSFP · oblique · 8.0mm · 1.61mm/px · 1 of 25 slices shown (3 of 18)]
[im 1/25]
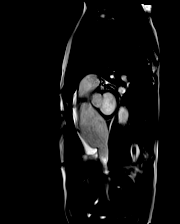

[Series 13: bSSFP · oblique · 8.0mm · 1.61mm/px · 1 of 25 slices shown (4 of 18)]
[im 1/25]
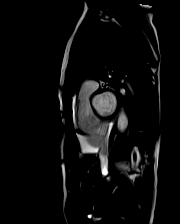

[Series 14: bSSFP · oblique · 8.0mm · 1.61mm/px · 1 of 25 slices shown (5 of 18)]
[im 1/25]
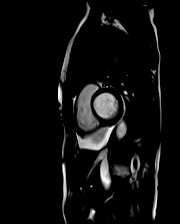

[Series 15: bSSFP · oblique · 8.0mm · 1.61mm/px · 1 of 25 slices shown (6 of 18)]
[im 1/25]
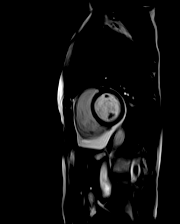

[Series 16: bSSFP · oblique · 8.0mm · 1.61mm/px · 1 of 25 slices shown (7 of 18)]
[im 1/25]
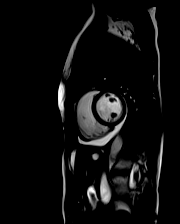

[Series 17: bSSFP · oblique · 8.0mm · 1.61mm/px · 1 of 25 slices shown (8 of 18)]
[im 1/25]
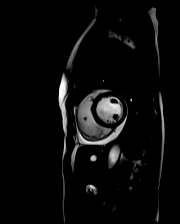

[Series 18: bSSFP · oblique · 8.0mm · 1.61mm/px · 1 of 25 slices shown (9 of 18)]
[im 1/25]
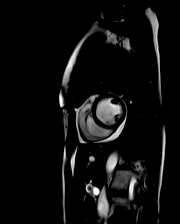

[Series 19: bSSFP · oblique · 8.0mm · 1.61mm/px · 1 of 25 slices shown (10 of 18)]
[im 1/25]
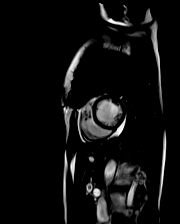

[Series 20: bSSFP · oblique · 8.0mm · 1.61mm/px · 1 of 25 slices shown (11 of 18)]
[im 1/25]
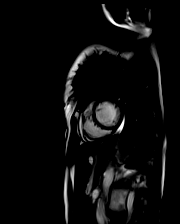

[Series 21: bSSFP · oblique · 8.0mm · 1.61mm/px · 1 of 25 slices shown (12 of 18)]
[im 1/25]
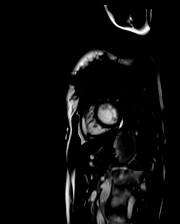

[Series 22: bSSFP · oblique · 8.0mm · 1.61mm/px · 1 of 25 slices shown (13 of 18)]
[im 1/25]
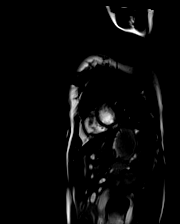

[Series 23: bSSFP · oblique · 8.0mm · 1.61mm/px · 1 of 25 slices shown (14 of 18)]
[im 1/25]
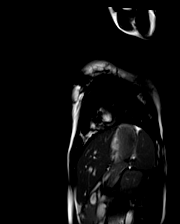

[Series 24: bSSFP · oblique · 8.0mm · 1.61mm/px · 1 of 25 slices shown (15 of 18)]
[im 1/25]
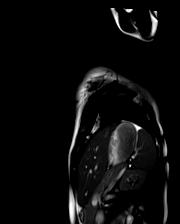

[Series 25: bSSFP · oblique · 8.0mm · 1.61mm/px · 1 of 25 slices shown (16 of 18)]
[im 1/25]
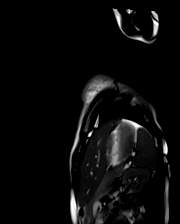

[Series 26: bSSFP · coronal · 6.0mm · 1.41mm/px · 1 of 25 slices shown (17 of 18)]
[im 1/25]
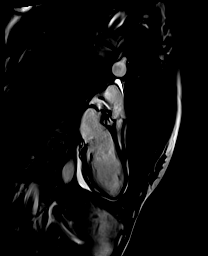

[Series 27: bSSFP · oblique · 6.0mm · 1.41mm/px · 1 of 25 slices shown (18 of 18)]
[im 1/25]
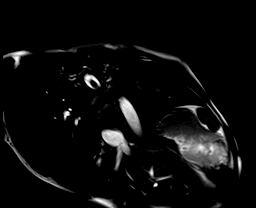

[45 of 48 positions shown; findings below may reference images not displayed]

FINDINGS: Limited images of the lung fields showed no gross abnormalities.

There was a moderate pericardial effusion with the primary
collection of fluid inferior to the heart and adjacent to the right
ventricle. There was no obvious organization and no pericardial cyst
was visualized. No evidence for tamponade (there was no
respirophasic variation of the interventricular septum).

Normal left ventricular size and wall thickness. No regional wall
motion abnormalities, LV EF 62%. Normal right ventricular size, wall
thickness, and systolic function with RV EF 55%. The left and right
atria were mildly dilated. Trileaflet aortic valve, no stenosis or
regurgitation. There was mild tricuspid regurgitation. There was no
more than mild mitral regurgitation with mild prolapse.

On delayed enhancement imaging, there was no myocardial late
gadolinium enhancement (LGE).

MEASUREMENTS:
MEASUREMENTS
LVEDV 114 mL

LVSV 70 mL
LVEF 62%

RVEDV 130 mL
RVSV 72 mL
RVEF 55%
IMPRESSION: 1. Moderate pericardial effusion with no tamponade. Effusion was
primarily inferior to the heart and adjacent to the RV. The
structure seen by echo with question of pericardial cyst was likely
the right atrial wall. The right atrium was enlarged by MRI and the
wall was mobile in the pericardial effusion.

2.  Normal LV size and wall motion, EF 62%.

3.  Normal RV size and systolic function, EF 55%.

4. No myocardial LGE, so no definitive evidence for prior MI,
infiltrative disease, or myocarditis.

Abimelk Tiger

## 2023-12-18 ENCOUNTER — Encounter: Payer: Self-pay | Admitting: Internal Medicine

## 2023-12-19 NOTE — Telephone Encounter (Signed)
 Lmom for scott to sch new pt appt with dr Ethel Henry

## 2023-12-20 NOTE — Telephone Encounter (Signed)
 Pt  Destiny Carpenter has been sch for 01-23-2024 130 pm

## 2023-12-26 ENCOUNTER — Ambulatory Visit (INDEPENDENT_AMBULATORY_CARE_PROVIDER_SITE_OTHER): Admitting: Internal Medicine

## 2023-12-26 ENCOUNTER — Encounter: Payer: Self-pay | Admitting: Internal Medicine

## 2023-12-26 VITALS — BP 110/80 | HR 77 | Temp 98.2°F | Ht 67.5 in | Wt 121.4 lb

## 2023-12-26 DIAGNOSIS — Z79899 Other long term (current) drug therapy: Secondary | ICD-10-CM

## 2023-12-26 DIAGNOSIS — Z0000 Encounter for general adult medical examination without abnormal findings: Secondary | ICD-10-CM | POA: Diagnosis not present

## 2023-12-26 NOTE — Patient Instructions (Addendum)
  Good to see you today . Can try  nasal  cortisone such as flonase  for  2 weeks to see if   helps  upper congestion  and  upper airway cough .  Agree with regular   cardiology follow up   but  exam is reassuring today .

## 2023-12-26 NOTE — Progress Notes (Signed)
 Chief Complaint  Patient presents with   Annual Exam    Pt reports she is here her annual check up. Not fasting.     HPI: Patient  Destiny Carpenter  63 y.o. comes in today for yearly visit  Preventive Health Care visit     Mom had  multiple surgeries and she was taking care of  her.    And then had vaginal bleeding and hysterectomy fo early endometrial cancer . Doing ok now  but  she wasn't to check out  as due  No cardiology sx of import active  physically . Last echo a year ago and with dr Lavonne Prairie who may be retiring . Aaron Aas Left ear still bothers her but tries to do less  manipulation  to avoid itching scratching   Gets some cough when lays down and sometimes congestion in allergy sx  no cp sob syncope  or personal arrhythmia   Health Maintenance  Topic Date Due   Zoster Vaccines- Shingrix (1 of 2) 03/26/2024 (Originally 12/19/2012)   HIV Screening  12/25/2024 (Originally 12/19/1975)   INFLUENZA VACCINE  03/29/2024   Fecal DNA (Cologuard)  09/27/2024   Cervical Cancer Screening (HPV/Pap Cotest)  11/25/2024   MAMMOGRAM  02/02/2025   DTaP/Tdap/Td (4 - Td or Tdap) 06/24/2029   COVID-19 Vaccine  Completed   Hepatitis C Screening  Completed   HPV VACCINES  Aged Out   Meningococcal B Vaccine  Aged Out   Health Maintenance Review LIFESTYLE:  Exercise:   y Tobacco/ETS:n Alcohol: soetimes wine  Sugar beverages: Sleep: 6 hours  Drug use: no HH of  3    Work: y    ROS:  GEN/ HEENT: No fever, significant weight changes sweats headaches vision problems hearing changes, CV/ PULM; No chest pain shortness of breath cough, syncope,edema  change in exercise tolerance. GI /GU: No adominal pain, vomiting, change in bowel habits. No blood in the stool. No significant GU symptoms. SKIN/HEME: ,no acute skin rashes suspicious lesions or bleeding. No lymphadenopathy, nodules, masses.  NEURO/ PSYCH:  No neurologic signs such as weakness numbness. No depression anxiety. IMM/  Allergy: No unusual infections.  Allergy .   REST of 12 system review negative except as per HPI   Past Medical History:  Diagnosis Date   Anemia    Family history of other specified malignant neoplasm    IBS (irritable bowel syndrome)    MVP (mitral valve prolapse)    Other and unspecified ovarian cyst    Raynaud's disease    UTI (lower urinary tract infection)    Varicella     Past Surgical History:  Procedure Laterality Date   TONSILLECTOMY      Family History  Problem Relation Age of Onset   Hyperlipidemia Other    Hypertension Father    Melanoma Other    Skin cancer Other    Heart disease Mother    Hypertension Mother    Atrial fibrillation Mother     Social History   Socioeconomic History   Marital status: Married    Spouse name: Not on file   Number of children: 2   Years of education: Not on file   Highest education level: Not on file  Occupational History   Not on file  Tobacco Use   Smoking status: Never   Smokeless tobacco: Never  Substance and Sexual Activity   Alcohol use: Yes    Comment: 1- 2 glasses wine 5 x per week  Drug use: No   Sexual activity: Not on file  Other Topics Concern   Not on file  Social History Narrative   Married   Homemaker   HH of 3 one at college   Patent attorney   PhD Math   Part time not working now   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     Cholecalciferol (VITAMIN D3 ADULT GUMMIES PO) Take by mouth.     No facility-administered medications prior to  visit.     EXAM:  BP 110/80 (BP Location: Right Arm, Patient Position: Sitting, Cuff Size: Normal)   Pulse 77   Temp 98.2 F (36.8 C) (Oral)   Ht 5' 7.5" (1.715 m)   Wt 121 lb 6.4 oz (55.1 kg)   LMP 01/28/2016 (Within Weeks)   SpO2 98%   BMI 18.73 kg/m   Body mass index is 18.73 kg/m. Wt Readings from Last 3 Encounters:  12/26/23 121 lb 6.4 oz (55.1 kg)  07/19/22 121 lb (54.9 kg)  09/21/21 122 lb 6.4 oz (55.5 kg)    Physical Exam: Vital signs reviewed WUX:LKGM is a well-developed well-nourished alert cooperative    who appearsr stated age in no acute distress.  HEENT: normocephalic atraumatic , Eyes: PERRL EOM's full, conjunctiva clear, Nares: paten,t no deformity discharge or tenderness., Ears: no deformity EAC's clear TMs with normal landmarks. Mouth: clear OP, no lesions, edema.  Moist mucous membranes. Dentition in adequate repair. NECK: supple without masses, thyromegaly or bruits. CHEST/PULM:  Clear to auscultation and percussion breath sounds equal no wheeze , rales or rhonchi. No chest wall deformities or tenderness. Slender  Breast: normal by inspection . No dimpling, discharge, masses, tenderness or discharge . CV: PMI is nondisplaced, S1 S2 no gallops, murmurs, rubs. Peripheral pulses are full without delay.  ABDOMEN: Bowel sounds normal nontender  No guard or rebound, no hepato splenomegal no CVA tenderness.   Extremtities:  No clubbing cyanosis or edema, no acute joint swelling or redness no focal atrophy NEURO:  Oriented x3, cranial nerves 3-12 appear to be intact, no obvious focal weakness,gait within normal limits no abnormal reflexes or asymmetrical SKIN: No acute rashes normal turgor, color, no bruising or petechiae. PSYCH: Oriented, good eye contact, no obvious depression anxiety, cognition and judgment appear normal. LN: no cervical axillary adenopathy  Lab Results  Component Value Date   WBC 5.4 09/21/2021   HGB 14.2 09/21/2021   HCT 42.8 09/21/2021    PLT 220 09/21/2021   GLUCOSE 87 10/19/2021   CHOL 189 09/21/2021   TRIG 56 09/21/2021   HDL 105 09/21/2021   LDLCALC 73 09/21/2021   ALT 28 12/21/2021   AST 27 12/21/2021   NA 139 10/19/2021   K 4.2 10/19/2021   CL 101 10/19/2021   CREATININE 0.94 10/19/2021   BUN 12 10/19/2021   CO2 27 10/19/2021   TSH 1.890 09/21/2021    BP Readings from Last 3 Encounters:  12/26/23 110/80  07/19/22 104/64  09/21/21 120/80    Lab plan  reviewed with patient   ASSESSMENT AND PLAN:  Discussed the following assessment and plan:    ICD-10-CM   1. Visit for preventive health examination  Z00.00 Basic metabolic panel with GFR    CBC with Differential/Platelet    Hemoglobin A1c    Hepatic function panel    Lipid panel    TSH    CANCELED: Basic metabolic panel with GFR    CANCELED: Hepatic function panel    CANCELED: Lipid panel    CANCELED: TSH    CANCELED: CBC with Differential/Platelet    CANCELED: Hemoglobin A1c    2. Medication management  Z79.899 Basic metabolic panel with GFR    CBC with Differential/Platelet    Hemoglobin A1c    Hepatic function panel    Lipid panel    TSH    CANCELED: Basic metabolic panel with GFR    CANCELED: Hepatic function panel    CANCELED: Lipid panel    CANCELED: TSH    CANCELED: CBC with Differential/Platelet    CANCELED: Hemoglobin A1c    Blood work to be Sent to preferred lab.  Non fasting today  Can follow up with cardiology regarding mild mitral valve abnormality  and pericardial effusion doing well by hx  Return in about 1 year (around 12/25/2024) for depending on results.  Patient Care Team: Rosena Bartle, Joaquim Muir, MD as PCP - General Audery Blazing Deannie Fabian, MD as PCP - Cardiology (Cardiology) [place], Amy, MD as Consulting Physician (Dermatology) Lenise Quince, MD (Cardiology) Patient Instructions   Good to see you today . Can try  nasal  cortisone such as flonase  for  2 weeks to see if   helps  upper congestion  and  upper airway cough .   Agree with regular   cardiology follow up   but  exam is reassuring today .   Shirla Hodgkiss K. Mujtaba Bollig M.D.

## 2023-12-27 ENCOUNTER — Encounter: Payer: Self-pay | Admitting: Internal Medicine

## 2023-12-27 LAB — CBC WITH DIFFERENTIAL/PLATELET
Absolute Lymphocytes: 1265 {cells}/uL (ref 850–3900)
Absolute Monocytes: 477 {cells}/uL (ref 200–950)
Basophils Absolute: 50 {cells}/uL (ref 0–200)
Basophils Relative: 0.8 %
Eosinophils Absolute: 149 {cells}/uL (ref 15–500)
Eosinophils Relative: 2.4 %
HCT: 41 % (ref 35.0–45.0)
Hemoglobin: 13.5 g/dL (ref 11.7–15.5)
MCH: 30.5 pg (ref 27.0–33.0)
MCHC: 32.9 g/dL (ref 32.0–36.0)
MCV: 92.6 fL (ref 80.0–100.0)
MPV: 10.5 fL (ref 7.5–12.5)
Monocytes Relative: 7.7 %
Neutro Abs: 4259 {cells}/uL (ref 1500–7800)
Neutrophils Relative %: 68.7 %
Platelets: 220 10*3/uL (ref 140–400)
RBC: 4.43 10*6/uL (ref 3.80–5.10)
RDW: 12.5 % (ref 11.0–15.0)
Total Lymphocyte: 20.4 %
WBC: 6.2 10*3/uL (ref 3.8–10.8)

## 2023-12-27 LAB — TSH: TSH: 1.4 m[IU]/L (ref 0.40–4.50)

## 2023-12-27 LAB — HEPATIC FUNCTION PANEL
AG Ratio: 2.1 (calc) (ref 1.0–2.5)
ALT: 25 U/L (ref 6–29)
AST: 27 U/L (ref 10–35)
Albumin: 5.1 g/dL (ref 3.6–5.1)
Alkaline phosphatase (APISO): 58 U/L (ref 37–153)
Bilirubin, Direct: 0.1 mg/dL (ref 0.0–0.2)
Globulin: 2.4 g/dL (ref 1.9–3.7)
Indirect Bilirubin: 0.5 mg/dL (ref 0.2–1.2)
Total Bilirubin: 0.6 mg/dL (ref 0.2–1.2)
Total Protein: 7.5 g/dL (ref 6.1–8.1)

## 2023-12-27 LAB — LIPID PANEL
Cholesterol: 195 mg/dL (ref <200)
HDL: 114 mg/dL (ref >=50)
LDL Cholesterol (Calc): 65 mg/dL
Non-HDL Cholesterol (Calc): 81 mg/dL (ref <130)
Total CHOL/HDL Ratio: 1.7 (calc) (ref <5.0)
Triglycerides: 84 mg/dL (ref <150)

## 2023-12-27 LAB — BASIC METABOLIC PANEL WITH GFR
BUN: 15 mg/dL (ref 7–25)
CO2: 32 mmol/L (ref 20–32)
Calcium: 10 mg/dL (ref 8.6–10.4)
Chloride: 99 mmol/L (ref 98–110)
Creat: 0.99 mg/dL (ref 0.50–1.05)
Glucose, Bld: 90 mg/dL (ref 65–99)
Potassium: 4.4 mmol/L (ref 3.5–5.3)
Sodium: 138 mmol/L (ref 135–146)
eGFR: 65 mL/min/{1.73_m2} (ref >=60)

## 2023-12-27 LAB — HEMOGLOBIN A1C
Hgb A1c MFr Bld: 5.9 % — ABNORMAL HIGH (ref <5.7)
Mean Plasma Glucose: 123 mg/dL
eAG (mmol/L): 6.8 mmol/L

## 2023-12-27 NOTE — Progress Notes (Signed)
 Results  in range or favorable  except A1c is on high side 5.9    but no diabetes . Continue lifestyle intervention healthy eating and exercise .

## 2024-04-19 ENCOUNTER — Telehealth: Payer: Self-pay | Admitting: Cardiology

## 2024-04-19 NOTE — Telephone Encounter (Signed)
 Pt c/o of Chest Pain: STAT if active (IN THIS MOMENT) CP, including tightness, pressure, jaw pain, shoulder/upper arm/back pain, SOB, nausea, and vomiting.  1. Are you having CP right now (tightness, pressure, or discomfort)?  Denies pain. Has been having pressure around chest and in back. Patient does feel pressure right now, beneath breast.  2. Are you experiencing any other symptoms (ex. SOB, nausea, vomiting, sweating)?  No   3. How long have you been experiencing CP?  At least 3 weeks to 1 month  4. Is your CP continuous or coming and going?  Coming and going   5. Have you taken Nitroglycerin?   No, doesn't take anything

## 2024-04-19 NOTE — Telephone Encounter (Signed)
 Spoke to patient. Verified name and DOB. Patient is having pressure and annoyance in chest. Patient is concerned it is heart related. Chest pressure started at 2 am this morning while asleep and was woken up by pressure. Patient was diaphoretic upon waking up. Patient feels like pain radiates to her back and under breast.   Patient denies dizziness, jaw and arm pain, shortness of breath, and chest pain. I scheduled patient to be seen at the office on Monday April 22, 2024 by Katlyn West NP. Advised patient to go to ER for evaluation if symptoms arise or worse. Patient verbalized understanding.  Josie RN

## 2024-04-21 NOTE — Progress Notes (Unsigned)
 Cardiology Office Note    Date:  04/22/2024  ID:  Tayah Idrovo, DOB [birth year], MRN [id number] PCP:  Charlett Apolinar POUR, MD  Cardiologist:  Redell Shallow, MD  Electrophysiologist:  None   Chief Complaint: Chest pain   History of Present Illness: .    Kalisa Girtman is a 63 y.o. female with visit-pertinent history of mitral valve prolapse and syncope.  Echocardiogram in January 2023 showed normal LV function, moderate pericardial effusion, possible pericardial cyst, mild prolapse of the anterior mitral valve leaflet.  Cardiac monitor in January 2023 showed sinus rhythm with occasional PAC, brief runs of ectopic atrial tachycardia and rare PVCs.  Cardiac MRI in February 2023 showed normal LV function and moderate pericardial effusion, right atrial enlargement also noted.  Patient was last seen in clinic by Dr. Shallow on 07/19/2022, it was noted that her palpitations worsened when she had alcohol but otherwise had been well-controlled.  It was noted that her prior syncopal episodes felt to be vagal in etiology and had no recurrence.  Echocardiogram on 09/07/2022 indicated LVEF 60%, LV with normal function, no RWMA, diastolic parameters were normal, RV systolic function and size was normal, normal PASP, a small pericardial effusion was present, noted posterior to the left ventricle and posterior to the right ventricle, no evidence of increased pericardial pressure, mitral valve is abnormal with thickened anterior valve prolapse, no evidence of mitral valve regurgitation or stenosis.  Today patient presents regarding increased chest discomfort.  Patient reports that for the last few weeks she has had different occurrences of chest and increased back discomfort, she reports a left-sided chest discomfort that feels as though it is under her left bottom rib, not associated with exertion.  She reports that it has been intermittent, mainly consisting over a 3-day period a  few weeks ago.  She also endorses increased back/rib discomfort, notes that this is worse when she lays flat on her back however improves when she rolls over onto her stomach.  She denies any shortness of breath, lower extremity edema, orthopnea or PND.  Notes that she has a history of sticky lung a few years ago that would improve with soft percussion.  She also reports some increased in fatigue.  She reports that her previously noted palpitations have overall improved.  She denies any presyncope or syncope.  Patient notes that she has not tried any ibuprofen or Tylenol for her back and chest discomfort. ROS: .   Today she denies shortness of breath, lower extremity edema, fatigue, palpitations, melena, hematuria, hemoptysis, diaphoresis, weakness, presyncope, syncope, orthopnea, and PND.  All other systems are reviewed and otherwise negative. Studies Reviewed: SABRA   EKG:  EKG is ordered today, personally reviewed, demonstrating  EKG Interpretation Date/Time:  Monday April 22 2024 11:26:37 EDT Ventricular Rate:  71 PR Interval:  126 QRS Duration:  92 QT Interval:  382 QTC Calculation: 415 R Axis:   94  Text Interpretation: Sinus rhythm with Premature atrial complexes Rightward axis Incomplete right bundle branch block Nonspecific ST and T wave abnormality Confirmed by Alp Goldwater [phone]) on 04/22/2024 12:13:44 PM   CV Studies: Cardiac studies reviewed are outlined and summarized above. Otherwise please see EMR for full report. Cardiac Studies & Procedures   ______________________________________________________________________________________________     ECHOCARDIOGRAM  ECHOCARDIOGRAM COMPLETE 09/07/2022  Narrative ECHOCARDIOGRAM REPORT    Patient Name:   Allahna LYNN BRYAN Hanssen Date of Exam: 09/07/2022 Medical Rec #:  [id number]  Height:       67.0 in Accession #:    [id number]                     Weight:       121.0 lb Date of Birth:  Jan 08, 1961                       BSA:          1.633 m Patient Age:    61 years                       BP:           100/60 mmHg Patient Gender: F                              HR:           70 bpm. Exam Location:  Outpatient  Procedure: 2D Echo, 3D Echo, Cardiac Doppler, Color Doppler and Strain Analysis  Indications:    Pericardial Effusion  History:        Patient has prior history of Echocardiogram examinations, most recent 08/31/2021. Arrythmias:PAC, Signs/Symptoms:Syncope; Risk Factors:Non-Smoker. Skin cancer; Mitral valve prolapse; possible pericardial cyst,.  Sonographer:    Orvil Holmes RDCS Referring Phys: 3 BRIAN S CRENSHAW  IMPRESSIONS   1. Left ventricular ejection fraction, by estimation, is 60%. Left ventricular ejection fraction by 3D volume is 59 %. The left ventricle has normal function. The left ventricle has no regional wall motion abnormalities. Left ventricular diastolic parameters were normal. The average left ventricular global longitudinal strain is -19.4 %. The global longitudinal strain is normal. 2. Right ventricular systolic function is normal. The right ventricular size is normal. There is normal pulmonary artery systolic pressure. 3. A small pericardial effusion is present. The pericardial effusion is posterior to the left ventricle and posterior to the right ventricle. There is no evidence of increased pericardial pressure 4. The mitral valve is abnormal- thickend with anterior valve prolapse. No evidence of mitral valve regurgitation. No evidence of mitral stenosis. 5. The aortic valve is tricuspid. Aortic valve regurgitation is not visualized. No aortic stenosis is present.  Comparison(s): Similar to slightly decreased pericardial effusion.  FINDINGS Left Ventricle: Left ventricular ejection fraction, by estimation, is 60%. Left ventricular ejection fraction by 3D volume is 59 %. The left ventricle has normal function. The left ventricle has no regional wall motion  abnormalities. The average left ventricular global longitudinal strain is -19.4 %. The global longitudinal strain is normal. The left ventricular internal cavity size was small. There is no left ventricular hypertrophy. Left ventricular diastolic parameters were normal.  Right Ventricle: The right ventricular size is normal. No increase in right ventricular wall thickness. Right ventricular systolic function is normal. There is normal pulmonary artery systolic pressure. The tricuspid regurgitant velocity is 2.55 m/s, and with an assumed right atrial pressure of 3 mmHg, the estimated right ventricular systolic pressure is 29.0 mmHg.  Left Atrium: Left atrial size was normal in size.  Right Atrium: Right atrial size was normal in size.  Pericardium: A small pericardial effusion is present. The pericardial effusion is posterior to the left ventricle and anterior to the right ventricle.  Mitral Valve: The mitral valve is abnormal. No evidence of mitral valve regurgitation. No evidence of mitral valve stenosis.  Tricuspid Valve: The tricuspid valve is normal in structure. Tricuspid valve  regurgitation is mild.  Aortic Valve: The aortic valve is tricuspid. Aortic valve regurgitation is not visualized. No aortic stenosis is present.  Pulmonic Valve: The pulmonic valve was grossly normal. Pulmonic valve regurgitation is not visualized.  Aorta: The aortic root is normal in size and structure.  IAS/Shunts: No atrial level shunt detected by color flow Doppler.   LEFT VENTRICLE PLAX 2D LVIDd:         3.71 cm         Diastology LVIDs:         2.08 cm         LV e' medial:    6.31 cm/s LV PW:         0.87 cm         LV E/e' medial:  16.2 LV IVS:        0.70 cm         LV e' lateral:   7.51 cm/s LVOT diam:     1.80 cm         LV E/e' lateral: 13.6 LV SV:         50 LV SV Index:   31              2D LVOT Area:     2.54 cm        Longitudinal Strain 2D Strain GLS  -19.4 % Avg:  3D Volume  EF LV 3D EF:    Left ventricul ar ejection fraction by 3D volume is 59 %.  3D Volume EF: 3D EF:        59 % LV EDV:       75 ml LV ESV:       30 ml LV SV:        44 ml  RIGHT VENTRICLE RV Basal diam:  2.92 cm RV Mid diam:    3.25 cm RV S prime:     17.40 cm/s TAPSE (M-mode): 2.8 cm  LEFT ATRIUM             Index        RIGHT ATRIUM           Index LA diam:        2.80 cm 1.71 cm/m   RA Area:     13.00 cm LA Vol (A2C):   35.2 ml 21.55 ml/m  RA Volume:   28.50 ml  17.45 ml/m LA Vol (A4C):   41.3 ml 25.29 ml/m LA Biplane Vol: 42.1 ml 25.78 ml/m AORTIC VALVE LVOT Vmax:   99.60 cm/s LVOT Vmean:  62.900 cm/s LVOT VTI:    0.196 m  AORTA Ao Root diam: 2.90 cm  MITRAL VALVE                TRICUSPID VALVE MV Area (PHT): 3.17 cm     TR Peak grad:   26.0 mmHg MV Decel Time: 239 msec     TR Vmax:        255.00 cm/s MV E velocity: 102.00 cm/s MV A velocity: 72.80 cm/s   SHUNTS MV E/A ratio:  1.40         Systemic VTI:  0.20 m Systemic Diam: 1.80 cm  Stanly Leavens MD Electronically signed by Stanly Leavens MD Signature Date/Time: 09/07/2022/12:05:15 PM    Final    MONITORS  LONG TERM MONITOR (3-14 DAYS) 09/03/2021  Narrative Patch Wear Time:  7 days and 2 hours (2022-12-26T18:43:25-0500 to 2023-01-02T20:47:24-0500)  Patient had a min HR of 42 bpm,  max HR of 193 bpm, and avg HR of 68 bpm. Predominant underlying rhythm was Sinus Rhythm. Slight P wave morphology changes were noted. 19 Supraventricular Tachycardia runs occurred, the run with the fastest interval lasting 9.4 secs with a max rate of 193 bpm (avg 158 bpm); the run with the fastest interval was also the longest. Ectopic Atrial Rhythm was present. Supraventricular Tachycardia and Ectopic Atrial Rhythm were detected within +/- 45 seconds of symptomatic patient event(s). Isolated SVEs were occasional (3.3%, 22880), SVE Couplets were rare (<1.0%, 753), and SVE Triplets were rare (<1.0%, 55).  Isolated VEs were rare (<1.0%), and no VE Couplets or VE Triplets were present.  Sinus bradycardia, normal sinus rhythm, sinus tachycardia, occasional PAC, brief runs of ectopic atrial tachycardia, rare PVC. Redell Shallow     CARDIAC MRI  MR CARDIAC MORPHOLOGY W WO CONTRAST 09/30/2021  Narrative CLINICAL DATA:  Pericardial abnormality  EXAM: CARDIAC MRI  TECHNIQUE: The patient was scanned on a 1.5 Tesla GE magnet. A dedicated cardiac coil was used. Functional imaging was done using Fiesta sequences. 2,3, and 4 chamber views were done to assess for RWMA's. Modified Simpson's rule using a short axis stack was used to calculate an ejection fraction on a dedicated work [employer]. The patient received 8 cc of Gadavist . After 10 minutes inversion recovery sequences were used to assess for infiltration and scar tissue.  FINDINGS: Limited images of the lung fields showed no gross abnormalities.  There was a moderate pericardial effusion with the primary collection of fluid inferior to the heart and adjacent to the right ventricle. There was no obvious organization and no pericardial cyst was visualized. No evidence for tamponade (there was no respirophasic variation of the interventricular septum).  Normal left ventricular size and wall thickness. No regional wall motion abnormalities, LV EF 62%. Normal right ventricular size, wall thickness, and systolic function with RV EF 55%. The left and right atria were mildly dilated. Trileaflet aortic valve, no stenosis or regurgitation. There was mild tricuspid regurgitation. There was no more than mild mitral regurgitation with mild prolapse.  On delayed enhancement imaging, there was no myocardial late gadolinium enhancement (LGE).  MEASUREMENTS: MEASUREMENTS LVEDV 114 mL  LVSV 70 mL LVEF 62%  RVEDV 130 mL RVSV 72 mL RVEF 55%  IMPRESSION: 1. Moderate pericardial effusion with no tamponade. Effusion  was primarily inferior to the heart and adjacent to the RV. The structure seen by echo with question of pericardial cyst was likely the right atrial wall. The right atrium was enlarged by MRI and the wall was mobile in the pericardial effusion.  2.  Normal LV size and wall motion, EF 62%.  3.  Normal RV size and systolic function, EF 55%.  4. No myocardial LGE, so no definitive evidence for prior MI, infiltrative disease, or myocarditis.  Dalton Mclean   Electronically Signed By: Ezra Shuck M.D. On: 10/01/2021 17:54   ______________________________________________________________________________________________       Current Reported Medications:.    No outpatient medications have been marked as taking for the 04/22/24 encounter (Office Visit) with Mathius Birkeland D, NP.    Physical Exam:    VS:  BP 98/66   Pulse 71   Ht 5' 7.5 (1.715 m)   Wt 119 lb (54 kg)   LMP 01/28/2016 (Within Weeks)   SpO2 99%   BMI 18.36 kg/m    Wt Readings from Last 3 Encounters:  04/22/24 119 lb (54 kg)  12/26/23 121 lb 6.4 oz (55.1  kg)  07/19/22 121 lb (54.9 kg)    GEN: Well nourished, well developed in no acute distress NECK: No JVD; No carotid bruits CARDIAC: RRR, no murmurs, rubs, gallops RESPIRATORY:  Clear to auscultation without rales, wheezing or rhonchi  ABDOMEN: Soft, non-tender, non-distended EXTREMITIES:  No edema; No acute deformity     Asessement and Plan:.    Chest pain: Patient reports that in recent weeks she has had different areas of chest discomfort occurring at different times.  She denies any chest pain on exertion however notes that she does have a family history of CAD, reports that her brother has history of stents.  Patient reports a left-sided chest discomfort that was intermittent and lasted over 3-day.  She also endorses increased mid scapular back discomfort that did not change with palpation or with stretching.  Patient agreeable to further evaluation with  coronary CTA.  Check CBC, bmet, CRP and sed rate as noted below.  Patient will take ivabradine prior to CTA.  Palpitations: Patient reports that her previously reported palpitations have significantly improved.  Mitral valve prolapse: Last echo in 08/2022 showed abnormal mitral valve with thickened anterior valve prolapse, no evidence of mitral valve regurgitation.  Today she reports increased chest and back discomfort, denies any shortness of breath.  Will reevaluate mitral valve prolapse with echocardiogram.  Pericardial effusion: Patient with history of moderate pericardial effusion, last echocardiogram in 08/2022 indicated that size was small.  Patient notes increased left-sided chest discomfort, does not believe it has been associated with exertion.  She denies any shortness of breath.  Notes some increased back discomfort when laying flat on her back, improves with rolling onto her stomach.  Denies chest pain with deep and elation.  Given history of pericardial effusion will repeat echo, check CRP and sed rate.   Disposition: F/u with Sayla Golonka, NP in 8 weeks.   Signed, Hisako Bugh D Keondrick Dilks, NP

## 2024-04-22 ENCOUNTER — Encounter: Payer: Self-pay | Admitting: Cardiology

## 2024-04-22 ENCOUNTER — Ambulatory Visit: Attending: Cardiology | Admitting: Cardiology

## 2024-04-22 ENCOUNTER — Other Ambulatory Visit: Payer: Self-pay | Admitting: Internal Medicine

## 2024-04-22 ENCOUNTER — Encounter: Payer: Self-pay | Admitting: Internal Medicine

## 2024-04-22 VITALS — BP 98/66 | HR 71 | Ht 67.5 in | Wt 119.0 lb

## 2024-04-22 DIAGNOSIS — R072 Precordial pain: Secondary | ICD-10-CM

## 2024-04-22 DIAGNOSIS — I059 Rheumatic mitral valve disease, unspecified: Secondary | ICD-10-CM | POA: Diagnosis not present

## 2024-04-22 DIAGNOSIS — I3139 Other pericardial effusion (noninflammatory): Secondary | ICD-10-CM

## 2024-04-22 DIAGNOSIS — R002 Palpitations: Secondary | ICD-10-CM

## 2024-04-22 DIAGNOSIS — Z1231 Encounter for screening mammogram for malignant neoplasm of breast: Secondary | ICD-10-CM

## 2024-04-22 NOTE — Patient Instructions (Signed)
 Medication Instructions:  No changes *If you need a refill on your cardiac medications before your next appointment, please call your pharmacy*  Lab Work: Today we are going to draw a CBC, Bmet, CRP, and Sed rate. If you have labs (blood work) drawn today and your tests are completely normal, you will receive your results only by: MyChart Message (if you have MyChart) OR A paper copy in the mail If you have any lab test that is abnormal or we need to change your treatment, we will call you to review the results.  Testing/Procedures: Your physician has requested that you have an echocardiogram. Echocardiography is a painless test that uses sound waves to create images of your heart. It provides your doctor with information about the size and shape of your heart and how well your heart's chambers and valves are working. This procedure takes approximately one hour. There are no restrictions for this procedure. Please do NOT wear cologne, perfume, aftershave, or lotions (deodorant is allowed). Please arrive 15 minutes prior to your appointment time.  Please note: We ask at that you not bring children with you during ultrasound (echo/ vascular) testing. Due to room size and safety concerns, children are not allowed in the ultrasound rooms during exams. Our front office staff cannot provide observation of children in our lobby area while testing is being conducted. An adult accompanying a patient to their appointment will only be allowed in the ultrasound room at the discretion of the ultrasound technician under special circumstances. We apologize for any inconvenience.  Follow-Up: At [facility], you and your health needs are our priority.  As part of our continuing mission to provide you with exceptional heart care, our providers are all part of one team.  This team includes your primary Cardiologist (physician) and Advanced Practice Providers or APPs (Physician Assistants and Nurse  Practitioners) who all work together to provide you with the care you need, when you need it.  Your next appointment:   2 month(s)  Provider:   Redell Shallow, MD or Katlyn West, NP      We recommend signing up for the patient portal called MyChart.  Sign up information is provided on this After Visit Summary.  MyChart is used to connect with patients for Virtual Visits (Telemedicine).  Patients are able to view lab/test results, encounter notes, upcoming appointments, etc.  Non-urgent messages can be sent to your provider as well.   To learn more about what you can do with MyChart, go to [email or web address].   Other Instructions   Your cardiac CT will be scheduled at one of the below locations:   [facility] [address] [place], [place] [address] [phone]  OR   [facility] [address]  [place], [place] [address]  If scheduled at [facility], please arrive at the [facility] and Children's Entrance (Entrance C2) of [facility] 30 minutes prior to test start time. You can use the FREE valet parking offered at entrance C (encouraged to control the heart rate for the test)  Proceed to the [facility] Radiology Department (first floor) to check-in and test prep.  All radiology patients and guests should use entrance C2 at [facility], accessed from [facility], even though the hospital's physical address listed is [address]  If scheduled at the Heart and Vascular Tower at [address], please enter the parking lot using the [address] entrance and use  the FREE valet service at the patient drop-off area. Enter the building and check-in with registration on the main floor.  Please follow these instructions carefully (unless otherwise directed):  An IV will be required for this test and Nitroglycerin will be given.   On the Night Before the Test: Be sure to Drink plenty of  water. Do not consume any caffeinated/decaffeinated beverages or chocolate 12 hours prior to your test. Do not take any antihistamines 12 hours prior to your test.  On the Day of the Test: Drink plenty of water until 1 hour prior to the test. Do not eat any food 1 hour prior to test. You may take your regular medications prior to the test.  Take Ivabradine 15 mg, two hours prior to test. Patients who wear a continuous glucose monitor MUST remove the device prior to scanning. FEMALES- please wear underwire-free bra if available, avoid dresses & tight clothing      After the Test: Drink plenty of water. After receiving IV contrast, you may experience a mild flushed feeling. This is normal. On occasion, you may experience a mild rash up to 24 hours after the test. This is not dangerous. If this occurs, you can take Benadryl 25 mg, Zyrtec, Claritin, or Allegra and increase your fluid intake. (Patients taking Tikosyn should avoid Benadryl, and may take Zyrtec, Claritin, or Allegra) If you experience trouble breathing, this can be serious. If it is severe call 911 IMMEDIATELY. If it is mild, please call our office.  We will call to schedule your test 2-4 weeks out understanding that some insurance companies will need an authorization prior to the service being performed.   For more information and frequently asked questions, please visit our website : [email or web address]  For non-scheduling related questions, please contact the cardiac imaging nurse navigator should you have any questions/concerns: Cardiac Imaging Nurse Navigators Direct Office Dial: [phone]   For scheduling needs, including cancellations and rescheduling, please call [place], [phone].

## 2024-04-23 ENCOUNTER — Ambulatory Visit: Payer: Self-pay | Admitting: Cardiology

## 2024-04-23 LAB — C-REACTIVE PROTEIN: CRP: <1 mg/L (ref 0–10)

## 2024-04-23 LAB — CBC
Hematocrit: 43.2 % (ref 34.0–46.6)
Hemoglobin: 14 g/dL (ref 11.1–15.9)
MCH: 30.8 pg (ref 26.6–33.0)
MCHC: 32.4 g/dL (ref 31.5–35.7)
MCV: 95 fL (ref 79–97)
Platelets: 192 x10E3/uL (ref 150–450)
RBC: 4.54 x10E6/uL (ref 3.77–5.28)
RDW: 12.7 % (ref 11.7–15.4)
WBC: 5 x10E3/uL (ref 3.4–10.8)

## 2024-04-23 LAB — BASIC METABOLIC PANEL WITH GFR
BUN/Creatinine Ratio: 15 (ref 12–28)
BUN: 14 mg/dL (ref 8–27)
CO2: 24 mmol/L (ref 20–29)
Calcium: 9.8 mg/dL (ref 8.7–10.3)
Chloride: 100 mmol/L (ref 96–106)
Creatinine, Ser: 0.94 mg/dL (ref 0.57–1.00)
Glucose: 85 mg/dL (ref 70–99)
Potassium: 4.4 mmol/L (ref 3.5–5.2)
Sodium: 139 mmol/L (ref 134–144)
eGFR: 69 mL/min/1.73 (ref >59)

## 2024-04-23 LAB — SEDIMENTATION RATE: Sed Rate: 2 mm/h (ref 0–40)

## 2024-04-24 NOTE — Telephone Encounter (Signed)
 So diagnostic report   said to get regular mammogram screening in one year . (Hopefully they wont need another call back unless they see a change )  So get routine mammogram at same  facility .

## 2024-05-07 ENCOUNTER — Telehealth: Payer: Self-pay | Admitting: Cardiology

## 2024-05-07 NOTE — Telephone Encounter (Signed)
 Patient is scheduled for cardiac CT on 05/09/24. She was reading CT instructions and states metoprolol has not been sent in for her.  Reviewed last office visit note from 04/22/24, BP too low for metoprolol, ivabradine was recommended by Katlyn West, NP. Rx was not sent in.  Patient states her BP usually runs low and likely still would not tolerate metoprolol. She reports HR today 62, and usually does not go up much higher than this, might get up to 70 occasionally.  Will forward to Katlyn West, NP to advise on sending ivabradine to pharmacy and what dosage.  Patient states when calling back if she does not answer go ahead and leave detailed message. Preferred pharmacy is [facility] [address].

## 2024-05-07 NOTE — Telephone Encounter (Signed)
 Pt would like a c/b regarding Metoprolol medication that she is to take 2 hrs prior to CT. Pt needs script called into pharmacy. Please advise

## 2024-05-09 ENCOUNTER — Ambulatory Visit (HOSPITAL_COMMUNITY)
Admission: RE | Admit: 2024-05-09 | Discharge: 2024-05-09 | Disposition: A | Discharge status: Home / Self Care | Source: Ambulatory Visit | Attending: Cardiology | Admitting: Cardiology

## 2024-05-09 DIAGNOSIS — R072 Precordial pain: Secondary | ICD-10-CM | POA: Insufficient documentation

## 2024-05-09 DIAGNOSIS — I3139 Other pericardial effusion (noninflammatory): Secondary | ICD-10-CM | POA: Diagnosis not present

## 2024-05-09 MED ORDER — NITROGLYCERIN 0.4 MG SL SUBL
0.8000 mg | SUBLINGUAL_TABLET | Freq: Once | SUBLINGUAL | Status: AC
Start: 2024-05-09 — End: 2024-05-09
  Administered 2024-05-09: 0.8 mg via SUBLINGUAL

## 2024-05-09 MED ORDER — IOHEXOL 350 MG/ML SOLN
100.0000 mL | Freq: Once | INTRAVENOUS | Status: AC | PRN
  Administered 2024-05-09: 100 mL via INTRAVENOUS

## 2024-05-15 ENCOUNTER — Other Ambulatory Visit (INDEPENDENT_AMBULATORY_CARE_PROVIDER_SITE_OTHER)

## 2024-05-15 DIAGNOSIS — I3139 Other pericardial effusion (noninflammatory): Secondary | ICD-10-CM

## 2024-05-15 DIAGNOSIS — R072 Precordial pain: Secondary | ICD-10-CM

## 2024-05-17 ENCOUNTER — Ambulatory Visit
Admission: RE | Admit: 2024-05-17 | Discharge: 2024-05-17 | Disposition: A | Discharge status: Home / Self Care | Source: Ambulatory Visit | Attending: Internal Medicine | Admitting: Internal Medicine

## 2024-05-17 DIAGNOSIS — Z1231 Encounter for screening mammogram for malignant neoplasm of breast: Secondary | ICD-10-CM

## 2024-05-17 LAB — ECHOCARDIOGRAM COMPLETE
Area-P 1/2: 3.37 cm2
S' Lateral: 2.59 cm

## 2024-06-23 NOTE — Progress Notes (Unsigned)
 Cardiology Office Note    Date:  06/24/2024  ID:  Destiny Carpenter, DOB [birth year], MRN [id number] PCP:  Charlett Apolinar POUR, MD  Cardiologist:  Redell Shallow, MD  Electrophysiologist:  None   Chief Complaint: Follow up for precordial pain   History of Present Illness: .   Destiny Carpenter is a 63 y.o. female with visit-pertinent history of mitral valve prolapse and syncope.   Echocardiogram in January 2023 showed normal LV function, moderate pericardial effusion, possible pericardial cyst, mild prolapse of the anterior mitral valve leaflet.  Cardiac monitor in January 2023 showed sinus rhythm with occasional PAC, brief runs of ectopic atrial tachycardia and rare PVCs.  Cardiac MRI in February 2023 showed normal LV function and moderate pericardial effusion, right atrial enlargement also noted.  Patient was seen in clinic by Dr. Shallow on 07/19/2022, it was noted that her palpitations worsened when she had alcohol but otherwise had been well-controlled. It was noted that her prior syncopal episodes felt to be vagal in etiology and had no recurrence. Echocardiogram on 09/07/2022 indicated LVEF 60%, LV with normal function, no RWMA, diastolic parameters were normal, RV systolic function and size was normal, normal PASP, a small pericardial effusion was present, noted posterior to the left ventricle and posterior to the right ventricle, no evidence of increased pericardial pressure, mitral valve is abnormal with thickened anterior valve prolapse, no evidence of mitral valve regurgitation or stenosis.   Patient was seen in clinic on 04/22/2024 regarding episodes of chest discomfort.  She reported that for the prior few weeks she had different recurrences of chest and increased back discomfort, noted a left-sided chest discomfort that felt as though it was under her bottom left rib, not associate exertion.  She reported it had been intermittent, mainly worse when lying flat on  her back however improved when she rolled over onto her stomach.  She denies any shortness of breath, lower extremity edema, orthopnea or PND.  However reported she had a history of sticky lungs appears prior that would improve with soft percussion.  She also reported increased fatigue.  Noted her palpitations had overall improved.  Echocardiogram and coronary CTA were ordered for further evaluation.  Coronary CTA on 05/09/2024 indicated a coronary calcium score of 0.  Echocardiogram on 05/15/2024 indicated LVEF 60 to 65%, no RWMA, diastolic primaries were normal, RV systolic function and size was normal, normal PASP, no evidence of cardiac tamponade, no evidence of mitral regurgitation or stenosis, aortic valve attrition was not visualized, no stenosis was present, pericardial effusion was localized near the right atrium and was noted to be smaller in size compared to prior echo.  Today she presents for follow-up.  She reports that she has been doing very well overall.  Continues to note on occasion some back pain, denies any increased shortness of breath, lower extremity edema, orthopnea or PND.   She notes occasional palpitations, notes worsen with alcohol intake, overall well-controlled.  She denies presyncope or syncope.  She remains very active. She notes that she has been doing very well overall. ROS: .   Today she denies chest pain, shortness of breath, lower extremity edema, fatigue, palpitations, melena, hematuria, hemoptysis, diaphoresis, weakness, presyncope, syncope, orthopnea, and PND.  All other systems are reviewed and otherwise negative. Studies Reviewed: SABRA   EKG:  EKG is not ordered today.  CV Studies: Cardiac studies reviewed are outlined and summarized above. Otherwise please see EMR for full report. Cardiac Studies & Procedures  ______________________________________________________________________________________________     ECHOCARDIOGRAM  ECHOCARDIOGRAM COMPLETE  05/15/2024  Narrative ECHOCARDIOGRAM REPORT    Patient Name:   Destiny Carpenter Date of Exam: 05/15/2024 Medical Rec #:  [id number]                      Height:       67.5 in Accession #:    [id number]                     Weight:       119.0 lb Date of Birth:  22-Dec-1960                      BSA:          1.631 m Patient Age:    63 years                       BP:           98/66 mmHg Patient Gender: F                              HR:           77 bpm. Exam Location:  Outpatient  Procedure: 2D Echo, 3D Echo, Color Doppler, Cardiac Doppler and Strain Analysis (Both Spectral and Color Flow Doppler were utilized during procedure).  Indications:    Pericarditis/Chest Pain  History:        Patient has prior history of Echocardiogram examinations, most recent 09/07/2022. Signs/Symptoms:Syncope; Risk Factors:Non-Smoker. Mitral Valve Prolapse; Pericardial effusion.  Sonographer:    Orvil Holmes RDCS Referring Phys: [id number] [facility] Jarae Panas   Sonographer Comments: Image acquisition challenging due to patient body habitus. IMPRESSIONS   1. Left ventricular ejection fraction, by estimation, is 60 to 65%. The left ventricle has normal function. The left ventricle has no regional wall motion abnormalities. Left ventricular diastolic parameters were normal. 2. Right ventricular systolic function is normal. The right ventricular size is normal. There is normal pulmonary artery systolic pressure. The estimated right ventricular systolic pressure is 25.3 mmHg. 3. There is no evidence of cardiac tamponade. 4. The mitral valve is myxomatous. No evidence of mitral valve regurgitation. No evidence of mitral stenosis. 5. The aortic valve is tricuspid. Aortic valve regurgitation is not visualized. No aortic stenosis is present. 6. The inferior vena cava is normal in size with greater than 50% respiratory variability, suggesting right atrial pressure of 3 mmHg.  Comparison(s): Prior images  reviewed side by side. The pericardial effusion is smaller.  FINDINGS Left Ventricle: Left ventricular ejection fraction, by estimation, is 60 to 65%. The left ventricle has normal function. The left ventricle has no regional wall motion abnormalities. Global longitudinal strain performed but not reported based on interpreter judgement due to suboptimal tracking. 3D ejection fraction reviewed and evaluated as part of the interpretation. Alternate measurement of EF is felt to be most reflective of LV function. The left ventricular internal cavity size was normal in size. There is no left ventricular hypertrophy. Left ventricular diastolic parameters were normal.  Right Ventricle: The right ventricular size is normal. No increase in right ventricular wall thickness. Right ventricular systolic function is normal. There is normal pulmonary artery systolic pressure. The tricuspid regurgitant velocity is 2.36 m/s, and with an assumed right atrial pressure of 3 mmHg, the estimated right ventricular systolic pressure is 25.3 mmHg.  Left Atrium: Left atrial size was normal in size.  Right Atrium: Right atrial size was normal in size.  Pericardium: There is no evidence of pericardial effusion. The pericardial effusion is localized near the right ventricle. There is no evidence of cardiac tamponade.  Mitral Valve: The mitral valve is myxomatous. Mild mitral annular calcification. No evidence of mitral valve regurgitation. No evidence of mitral valve stenosis.  Tricuspid Valve: The tricuspid valve is normal in structure. Tricuspid valve regurgitation is mild . No evidence of tricuspid stenosis.  Aortic Valve: The aortic valve is tricuspid. Aortic valve regurgitation is not visualized. No aortic stenosis is present.  Pulmonic Valve: The pulmonic valve was normal in structure. Pulmonic valve regurgitation is not visualized. No evidence of pulmonic stenosis.  Aorta: The aortic root is normal in size and  structure.  Venous: The inferior vena cava is normal in size with greater than 50% respiratory variability, suggesting right atrial pressure of 3 mmHg.  IAS/Shunts: No atrial level shunt detected by color flow Doppler.   LEFT VENTRICLE PLAX 2D LVIDd:         3.78 cm   Diastology LVIDs:         2.59 cm   LV e' medial:    6.09 cm/s LV PW:         2.19 cm   LV E/e' medial:  16.4 LV IVS:        0.69 cm   LV e' lateral:   8.04 cm/s LVOT diam:     1.70 cm   LV E/e' lateral: 12.4 LV SV:         45 LV SV Index:   27 LVOT Area:     2.27 cm   RIGHT VENTRICLE RV Basal diam:  2.75 cm     PULMONARY VEINS RV Mid diam:    1.70 cm     A Reversal Velocity: 24.10 cm/s RV S prime:     12.30 cm/s  Diastolic Velocity:  28.00 cm/s TAPSE (M-mode): 2.5 cm      S/D Velocity:        1.40 Systolic Velocity:   38.10 cm/s  LEFT ATRIUM           Index        RIGHT ATRIUM          Index LA diam:      2.50 cm 1.53 cm/m   RA Area:     6.81 cm LA Vol (A2C): 19.0 ml 11.65 ml/m  RA Volume:   11.70 ml 7.17 ml/m LA Vol (A4C): 22.5 ml 13.80 ml/m AORTIC VALVE LVOT Vmax:   90.90 cm/s LVOT Vmean:  58.100 cm/s LVOT VTI:    0.197 m  AORTA Ao Root diam: 2.70 cm Ao Asc diam:  3.30 cm  MITRAL VALVE               TRICUSPID VALVE MV Area (PHT): 3.37 cm    TR Peak grad:   22.3 mmHg MV Decel Time: 225 msec    TR Vmax:        236.00 cm/s MV E velocity: 99.70 cm/s MV A velocity: 78.60 cm/s  SHUNTS MV E/A ratio:  1.27        Systemic VTI:  0.20 m Systemic Diam: 1.70 cm  Jerel Croitoru MD Electronically signed by Jerel Balding MD Signature Date/Time: 05/17/2024/3:43:53 PM    Final    MONITORS  LONG TERM MONITOR (3-14 DAYS) 09/03/2021  Narrative Patch Wear Time:  7 days  and 2 hours (2022-12-26T18:43:25-0500 to 2023-01-02T20:47:24-0500)  Patient had a min HR of 42 bpm, max HR of 193 bpm, and avg HR of 68 bpm. Predominant underlying rhythm was Sinus Rhythm. Slight P wave morphology changes were noted. 19  Supraventricular Tachycardia runs occurred, the run with the fastest interval lasting 9.4 secs with a max rate of 193 bpm (avg 158 bpm); the run with the fastest interval was also the longest. Ectopic Atrial Rhythm was present. Supraventricular Tachycardia and Ectopic Atrial Rhythm were detected within +/- 45 seconds of symptomatic patient event(s). Isolated SVEs were occasional (3.3%, 22880), SVE Couplets were rare (<1.0%, 753), and SVE Triplets were rare (<1.0%, 55). Isolated VEs were rare (<1.0%), and no VE Couplets or VE Triplets were present.  Sinus bradycardia, normal sinus rhythm, sinus tachycardia, occasional PAC, brief runs of ectopic atrial tachycardia, rare PVC. Redell Shallow   CT SCANS  CT CORONARY MORPH W/CTA COR W/SCORE 05/09/2024  Addendum 05/19/2024  3:42 PM ADDENDUM REPORT: 05/19/2024 15:40  EXAM: OVER-READ INTERPRETATION  CT CHEST  The following report is an over-read performed by radiologist Dr. Ree Levy [facility] on 05/19/2024. This over-read does not include interpretation of cardiac or coronary anatomy or pathology. The coronary CTA interpretation by the cardiologist is attached.  COMPARISON:  None.  FINDINGS: Mediastinum/Nodes: No solid / cystic mediastinal masses. The visualized esophagus is nondistended precluding optimal assessment. No thoracic lymphadenopathy by size criteria.  Lungs/Pleura: Imaged tracheo-bronchial tree is patent. There are dependent changes in bilateral lungs. No mass or consolidation. No pleural effusion or pneumothorax. No suspicious lung nodules.  Upper Abdomen: Visualized upper abdominal viscera within normal limits.  Musculoskeletal: The visualized soft tissues of the chest wall are grossly unremarkable. No suspicious osseous lesions.  IMPRESSION: No acute or significant extracardiac findings.   Electronically Signed By: Ree Molt M.D. On: 05/19/2024 15:40  Narrative CLINICAL DATA:  63 yo  female with chest pain  EXAM: Cardiac/Coronary CTA  TECHNIQUE: A non-contrast, gated CT scan was obtained with axial slices of 2.5 mm through the heart for calcium scoring. Calcium scoring was performed using the Agatston method. A 120 kV prospective, gated, contrast cardiac CT scan was obtained. Gantry rotation speed was 230 msec and collimation was 0.63 mm. Two sublingual nitroglycerin  tablets (0.8 mg) were given. The 3D data set was reconstructed with motion correction for the best systolic or diastolic phase. Images were analyzed on a dedicated workstation using MPR, MIP, and VRT modes. The patient received 95 cc of contrast.  FINDINGS: Image quality: Excellent.  Noise artifact is: Limited.  Coronary Arteries:  Normal coronary origin.  Right dominance.  Left main: The left main is a large caliber vessel with a normal take off from the left coronary cusp that bifurcates to form a left anterior descending artery and a left circumflex artery. There is no plaque or stenosis.  Left anterior descending artery: The LAD is patent without evidence of plaque or stenosis. The LAD gives off 3 patent diagonal branches.  Left circumflex artery: The LCX is non-dominant and patent with no evidence of plaque or stenosis. The LCX gives off 2 patent obtuse marginal branches.  Right coronary artery: The RCA is dominant with normal take off from the right coronary cusp. There is no evidence of plaque or stenosis. The RCA terminates as a PDA and right posterolateral branch without evidence of plaque or stenosis.  Right Atrium: Right atrial size is within normal limits.  Right Ventricle: The right ventricular cavity is within normal  limits.  Left Atrium: Left atrial size is normal in size with no left atrial appendage filling defect.  Left Ventricle: The ventricular cavity size is within normal limits.  Pulmonary arteries: Not well visualized.  Pulmonary veins: Normal pulmonary  venous drainage.  Pericardium: Normal thickness without significant effusion or calcium present.  Cardiac valves: The aortic valve is trileaflet without significant calcification. The mitral valve is normal without significant calcification.  Aorta: Normal caliber without significant disease.  Extra-cardiac findings: See attached radiology report for non-cardiac structures.  IMPRESSION: 1. Coronary calcium score of 0. This was 0 percentile for age-, sex, and race-matched controls.  2. Normal coronary origin with right dominance.  3. Normal coronary arteries.  RECOMMENDATIONS: CAD-RADS 0: No evidence of CAD (0%). Consider non-atherosclerotic causes of chest pain.  Redell Shallow, MD  Electronically Signed: By: Redell Shallow M.D. On: 05/09/2024 12:07   CARDIAC MRI  MR CARDIAC MORPHOLOGY W WO CONTRAST 09/30/2021  Narrative CLINICAL DATA:  Pericardial abnormality  EXAM: CARDIAC MRI  TECHNIQUE: The patient was scanned on a 1.5 Tesla GE magnet. A dedicated cardiac coil was used. Functional imaging was done using Fiesta sequences. 2,3, and 4 chamber views were done to assess for RWMA's. Modified Simpson's rule using a short axis stack was used to calculate an ejection fraction on a dedicated work [employer]. The patient received 8 cc of Gadavist . After 10 minutes inversion recovery sequences were used to assess for infiltration and scar tissue.  FINDINGS: Limited images of the lung fields showed no gross abnormalities.  There was a moderate pericardial effusion with the primary collection of fluid inferior to the heart and adjacent to the right ventricle. There was no obvious organization and no pericardial cyst was visualized. No evidence for tamponade (there was no respirophasic variation of the interventricular septum).  Normal left ventricular size and wall thickness. No regional wall motion abnormalities, LV EF 62%. Normal right  ventricular size, wall thickness, and systolic function with RV EF 55%. The left and right atria were mildly dilated. Trileaflet aortic valve, no stenosis or regurgitation. There was mild tricuspid regurgitation. There was no more than mild mitral regurgitation with mild prolapse.  On delayed enhancement imaging, there was no myocardial late gadolinium enhancement (LGE).  MEASUREMENTS: MEASUREMENTS LVEDV 114 mL  LVSV 70 mL LVEF 62%  RVEDV 130 mL RVSV 72 mL RVEF 55%  IMPRESSION: 1. Moderate pericardial effusion with no tamponade. Effusion was primarily inferior to the heart and adjacent to the RV. The structure seen by echo with question of pericardial cyst was likely the right atrial wall. The right atrium was enlarged by MRI and the wall was mobile in the pericardial effusion.  2.  Normal LV size and wall motion, EF 62%.  3.  Normal RV size and systolic function, EF 55%.  4. No myocardial LGE, so no definitive evidence for prior MI, infiltrative disease, or myocarditis.  Dalton Mclean   Electronically Signed By: Ezra Shuck M.D. On: 10/01/2021 17:54   ______________________________________________________________________________________________       Current Reported Medications:.    Current Meds  Medication Sig   Ascorbic Acid (VITAMIN C GUMMIE PO) Take by mouth.   Cholecalciferol (VITAMIN D3 ADULT GUMMIES PO) Take by mouth.   Physical Exam:    VS:  BP 112/78 (BP Location: Left Arm, Patient Position: Sitting, Cuff Size: Normal)   Pulse 68   Resp 16   Ht 5' 7 (1.702 m)   Wt 121 lb 12.8 oz (55.2  kg)   LMP 01/28/2016 (Within Weeks)   SpO2 97%   BMI 19.08 kg/m    Wt Readings from Last 3 Encounters:  06/24/24 121 lb 12.8 oz (55.2 kg)  04/22/24 119 lb (54 kg)  12/26/23 121 lb 6.4 oz (55.1 kg)    GEN: Well nourished, well developed in no acute distress NECK: No JVD; No carotid bruits CARDIAC: RRR, no murmurs, rubs, gallops RESPIRATORY:  Clear to  auscultation without rales, wheezing or rhonchi  ABDOMEN: Soft, non-tender, non-distended EXTREMITIES:  No edema; No acute deformity     Asessement and Plan:.    Precordial pain: Patient previously presented with episodes of precordial pain and increased back pain.  Coronary CTA indicated coronary calcium score of 0, echocardiogram reassuring as noted above.  She does note occasional back pain however feels this likely more musculoskeletal related.  She will continue to monitor. Heart healthy diet and regular cardiovascular exercise encouraged.    Palpitations: Patient notes history of increased palpitations, she notes that these do intermittently swell color however typically are associated with increased alcohol intake.  Are overall infrequent and not bothersome.  She will continue to monitor and notify the office if worsening.  Mitral valve prolapse: Echo in 08/2022 showed abnormal mitral valve with thickened anterior valve prolapse, no evidence of mitral valve regurgitation.  Recent echo 05/15/2024 showed no evidence of mitral valve regurgitation or stenosis.  Pericardial effusion: Patient with history of moderate pericardial effusion, echocardiogram in 08/2022 indicated that size was small.  Last echocardiogram in 04/2024 indicated that pericardial effusion was smaller in size compared to echo in 08/2022.   Disposition: F/u with Dr. Pietro in 18 months or sooner if needed.   Signed, Zaidan Keeble D Amilcar Reever, NP

## 2024-06-24 ENCOUNTER — Ambulatory Visit: Attending: Cardiology | Admitting: Cardiology

## 2024-06-24 ENCOUNTER — Encounter: Payer: Self-pay | Admitting: Cardiology

## 2024-06-24 VITALS — BP 112/78 | HR 68 | Resp 16 | Ht 67.0 in | Wt 121.8 lb

## 2024-06-24 DIAGNOSIS — R002 Palpitations: Secondary | ICD-10-CM

## 2024-06-24 DIAGNOSIS — R072 Precordial pain: Secondary | ICD-10-CM

## 2024-06-24 DIAGNOSIS — I349 Nonrheumatic mitral valve disorder, unspecified: Secondary | ICD-10-CM

## 2024-06-24 DIAGNOSIS — I3139 Other pericardial effusion (noninflammatory): Secondary | ICD-10-CM | POA: Diagnosis not present

## 2024-06-24 DIAGNOSIS — I059 Rheumatic mitral valve disease, unspecified: Secondary | ICD-10-CM

## 2024-06-24 NOTE — Patient Instructions (Signed)
 Medication Instructions:  Your physician recommends that you continue on your current medications as directed. Please refer to the Current Medication list given to you today.  *If you need a refill on your cardiac medications before your next appointment, please call your pharmacy*  Lab Work: None ordered If you have labs (blood work) drawn today and your tests are completely normal, you will receive your results only by: MyChart Message (if you have MyChart) OR A paper copy in the mail If you have any lab test that is abnormal or we need to change your treatment, we will call you to review the results.  Testing/Procedures: None ordered  Follow-Up: At [facility], you and your health needs are our priority.  As part of our continuing mission to provide you with exceptional heart care, our providers are all part of one team.  This team includes your primary Cardiologist (physician) and Advanced Practice Providers or APPs (Physician Assistants and Nurse Practitioners) who all work together to provide you with the care you need, when you need it.  Your next appointment:   18 month(s)  Provider:   Redell Shallow, MD    We recommend signing up for the patient portal called MyChart.  Sign up information is provided on this After Visit Summary.  MyChart is used to connect with patients for Virtual Visits (Telemedicine).  Patients are able to view lab/test results, encounter notes, upcoming appointments, etc.  Non-urgent messages can be sent to your provider as well.   To learn more about what you can do with MyChart, go to [email or web address].

## 2024-12-17 ENCOUNTER — Encounter: Admitting: Internal Medicine

## 2024-12-24 ENCOUNTER — Encounter: Admitting: Internal Medicine
